# Patient Record
Sex: Female | Born: 1960 | Race: Black or African American | Hispanic: No | Smoking: Current every day smoker
Health system: Southern US, Community
[De-identification: ages and names within clinical notes are randomized; demographics above are authoritative.]

## PROBLEM LIST (undated history)

## (undated) DIAGNOSIS — N939 Abnormal uterine and vaginal bleeding, unspecified: Secondary | ICD-10-CM

## (undated) DIAGNOSIS — N888 Other specified noninflammatory disorders of cervix uteri: Secondary | ICD-10-CM

## (undated) DIAGNOSIS — E0590 Thyrotoxicosis, unspecified without thyrotoxic crisis or storm: Secondary | ICD-10-CM

## (undated) DIAGNOSIS — N879 Dysplasia of cervix uteri, unspecified: Secondary | ICD-10-CM

## (undated) HISTORY — PX: OTHER SURGICAL HISTORY: SHX169

## (undated) HISTORY — PX: TUBAL LIGATION: SHX77

---

## 1999-05-24 ENCOUNTER — Emergency Department (HOSPITAL_COMMUNITY): Admission: EM | Admit: 1999-05-24 | Discharge: 1999-05-24 | Payer: Self-pay | Admitting: Emergency Medicine

## 2000-06-07 ENCOUNTER — Ambulatory Visit (HOSPITAL_COMMUNITY): Admission: AD | Admit: 2000-06-07 | Discharge: 2000-06-07 | Payer: Self-pay | Admitting: *Deleted

## 2002-07-17 ENCOUNTER — Encounter: Payer: Self-pay | Admitting: Obstetrics

## 2002-07-17 ENCOUNTER — Ambulatory Visit (HOSPITAL_COMMUNITY): Admission: RE | Admit: 2002-07-17 | Discharge: 2002-07-17 | Payer: Self-pay | Admitting: Obstetrics

## 2003-06-07 ENCOUNTER — Encounter: Payer: Self-pay | Admitting: Internal Medicine

## 2003-06-07 LAB — CONVERTED CEMR LAB

## 2003-06-29 ENCOUNTER — Emergency Department (HOSPITAL_COMMUNITY): Admission: EM | Admit: 2003-06-29 | Discharge: 2003-06-29 | Payer: Self-pay | Admitting: Emergency Medicine

## 2007-05-27 ENCOUNTER — Emergency Department (HOSPITAL_COMMUNITY): Admission: EM | Admit: 2007-05-27 | Discharge: 2007-05-27 | Payer: Self-pay | Admitting: Emergency Medicine

## 2007-08-05 ENCOUNTER — Ambulatory Visit: Payer: Self-pay | Admitting: Internal Medicine

## 2007-08-05 ENCOUNTER — Encounter: Payer: Self-pay | Admitting: Internal Medicine

## 2007-08-05 DIAGNOSIS — M129 Arthropathy, unspecified: Secondary | ICD-10-CM | POA: Insufficient documentation

## 2007-08-05 DIAGNOSIS — N764 Abscess of vulva: Secondary | ICD-10-CM | POA: Insufficient documentation

## 2007-08-05 DIAGNOSIS — K219 Gastro-esophageal reflux disease without esophagitis: Secondary | ICD-10-CM | POA: Insufficient documentation

## 2007-08-05 LAB — CONVERTED CEMR LAB
AST: 16 units/L (ref 0–37)
Bilirubin, Direct: 0.1 mg/dL (ref 0.0–0.3)
Chloride: 106 meq/L (ref 96–112)
Eosinophils Absolute: 0.4 10*3/uL (ref 0.0–0.6)
Eosinophils Relative: 3.4 % (ref 0.0–5.0)
GFR calc non Af Amer: 115 mL/min
Glucose, Bld: 85 mg/dL (ref 70–99)
HCT: 34.3 % — ABNORMAL LOW (ref 36.0–46.0)
Hemoglobin: 11.5 g/dL — ABNORMAL LOW (ref 12.0–15.0)
Lymphocytes Relative: 15.6 % (ref 12.0–46.0)
MCV: 84.3 fL (ref 78.0–100.0)
Monocytes Absolute: 0.1 10*3/uL — ABNORMAL LOW (ref 0.2–0.7)
Neutrophils Relative %: 80.1 % — ABNORMAL HIGH (ref 43.0–77.0)
Potassium: 3.9 meq/L (ref 3.5–5.1)
RBC: 4.06 M/uL (ref 3.87–5.11)
Sodium: 139 meq/L (ref 135–145)
Total Bilirubin: 0.7 mg/dL (ref 0.3–1.2)
Uric Acid, Serum: 4.3 mg/dL (ref 2.4–7.0)
WBC: 11.6 10*3/uL — ABNORMAL HIGH (ref 4.5–10.5)

## 2007-08-10 ENCOUNTER — Emergency Department (HOSPITAL_COMMUNITY): Admission: EM | Admit: 2007-08-10 | Discharge: 2007-08-10 | Payer: Self-pay | Admitting: Emergency Medicine

## 2007-09-26 ENCOUNTER — Encounter: Payer: Self-pay | Admitting: Internal Medicine

## 2007-09-29 ENCOUNTER — Ambulatory Visit: Payer: Self-pay | Admitting: Internal Medicine

## 2007-09-29 DIAGNOSIS — L280 Lichen simplex chronicus: Secondary | ICD-10-CM | POA: Insufficient documentation

## 2007-09-29 DIAGNOSIS — R7309 Other abnormal glucose: Secondary | ICD-10-CM

## 2008-01-12 ENCOUNTER — Ambulatory Visit: Payer: Self-pay | Admitting: Endocrinology

## 2008-01-12 DIAGNOSIS — M2550 Pain in unspecified joint: Secondary | ICD-10-CM | POA: Insufficient documentation

## 2008-02-09 ENCOUNTER — Telehealth: Payer: Self-pay | Admitting: Endocrinology

## 2009-03-25 ENCOUNTER — Emergency Department (HOSPITAL_COMMUNITY): Admission: EM | Admit: 2009-03-25 | Discharge: 2009-03-26 | Payer: Self-pay | Admitting: Emergency Medicine

## 2009-11-28 ENCOUNTER — Emergency Department (HOSPITAL_COMMUNITY): Admission: EM | Admit: 2009-11-28 | Discharge: 2009-11-28 | Payer: Self-pay | Admitting: Emergency Medicine

## 2009-11-29 ENCOUNTER — Emergency Department (HOSPITAL_COMMUNITY): Admission: EM | Admit: 2009-11-29 | Discharge: 2009-11-29 | Payer: Self-pay | Admitting: Family Medicine

## 2010-04-24 ENCOUNTER — Emergency Department (HOSPITAL_COMMUNITY): Admission: EM | Admit: 2010-04-24 | Discharge: 2010-04-24 | Payer: Self-pay | Admitting: Emergency Medicine

## 2010-05-11 ENCOUNTER — Emergency Department (HOSPITAL_COMMUNITY): Admission: EM | Admit: 2010-05-11 | Discharge: 2010-05-11 | Payer: Self-pay | Admitting: Emergency Medicine

## 2010-07-21 ENCOUNTER — Emergency Department (HOSPITAL_COMMUNITY): Admission: EM | Admit: 2010-07-21 | Discharge: 2010-07-21 | Payer: Self-pay | Admitting: Emergency Medicine

## 2010-10-19 ENCOUNTER — Ambulatory Visit (HOSPITAL_COMMUNITY)
Admission: RE | Admit: 2010-10-19 | Discharge: 2010-10-19 | Payer: Self-pay | Source: Home / Self Care | Attending: Internal Medicine | Admitting: Internal Medicine

## 2010-10-27 ENCOUNTER — Encounter
Admission: RE | Admit: 2010-10-27 | Discharge: 2010-10-27 | Payer: Self-pay | Source: Home / Self Care | Attending: Family Medicine | Admitting: Family Medicine

## 2010-11-02 ENCOUNTER — Encounter (INDEPENDENT_AMBULATORY_CARE_PROVIDER_SITE_OTHER): Payer: Self-pay | Admitting: Family Medicine

## 2010-11-02 LAB — CONVERTED CEMR LAB
BUN: 10 mg/dL
CO2: 26 meq/L
Calcium: 9.1 mg/dL
Chloride: 105 meq/L
Creatinine, Ser: 0.61 mg/dL
Glucose, Bld: 87 mg/dL
Potassium: 4.7 meq/L
Sodium: 138 meq/L
TSH: 2.987 u[IU]/mL
Uric Acid, Serum: 4.8 mg/dL
Vit D, 25-Hydroxy: 13 ng/mL — ABNORMAL LOW

## 2010-11-03 ENCOUNTER — Ambulatory Visit (HOSPITAL_COMMUNITY)
Admission: RE | Admit: 2010-11-03 | Discharge: 2010-11-03 | Payer: Self-pay | Source: Home / Self Care | Attending: Family Medicine | Admitting: Family Medicine

## 2011-01-06 LAB — WOUND CULTURE

## 2011-01-06 LAB — DIFFERENTIAL
Basophils Absolute: 0 10*3/uL (ref 0.0–0.1)
Eosinophils Relative: 1 % (ref 0–5)
Lymphocytes Relative: 10 % — ABNORMAL LOW (ref 12–46)
Neutro Abs: 14.9 10*3/uL — ABNORMAL HIGH (ref 1.7–7.7)

## 2011-01-06 LAB — CBC
Platelets: 528 10*3/uL — ABNORMAL HIGH (ref 150–400)
RDW: 18.3 % — ABNORMAL HIGH (ref 11.5–15.5)
WBC: 17.3 10*3/uL — ABNORMAL HIGH (ref 4.0–10.5)

## 2011-01-06 LAB — URINE CULTURE
Colony Count: >=100000
Culture  Setup Time: 201107212139

## 2011-01-06 LAB — URINALYSIS, ROUTINE W REFLEX MICROSCOPIC
Ketones, ur: NEGATIVE mg/dL
Nitrite: NEGATIVE
pH: 5.5 (ref 5.0–8.0)

## 2011-01-06 LAB — URINE MICROSCOPIC-ADD ON

## 2011-01-06 LAB — BASIC METABOLIC PANEL
BUN: 8 mg/dL (ref 6–23)
Creatinine, Ser: 0.65 mg/dL (ref 0.4–1.2)
GFR calc non Af Amer: >60 mL/min (ref >60)

## 2011-01-29 LAB — WOUND CULTURE

## 2011-03-09 NOTE — Op Note (Signed)
[facility]  Patient:    Alicia Brandt, Alicia Brandt                     MRN: [id number] Proc. Date: 06/07/00 Adm. Date:  [id number] Disc. Date: [id number] Attending:  Venita Sheffield CC:         Kathreen Cosier, M.D.   Operative Report  PREOPERATIVE DIAGNOSIS:       Multiple perineal abscesses.  POSTOPERATIVE DIAGNOSIS:      Multiple perineal abscesses secondary to hydradenitis suppurativa.  OPERATION:                    Incision and drainage.  SURGEON:                      Marnee Spring. Wiliam Ke, M.D.  ASSISTANT:                    None.  ANESTHESIA:                   General by hospital.  DESCRIPTION OF PROCEDURE:     Under good general anesthesia the patient was placed in the dorsal lithotomy position.  The entire perineum and both groin folds were markedly indurated with several areas of pus drainage.  There was only one abscess that was deeper than subcutaneous and this was near the left labia majora.  The abscess was opened widely, pus was drained and cultured and the wound was packed with Iodoform gauze.  Five other areas of pus drainage in the perineum and paravaginal perirectal area were drained and packed with Iodoform gauze.  After hemostasis was obtained with electrocautery current, wounds were dressed with peri pad and the patient was taken down from lithotomy position and left the operating room in satisfactory condition after sponge and needle counts were verified. DD:  06/07/00 TD:  06/10/00 Job: [id number]

## 2011-04-19 ENCOUNTER — Emergency Department (HOSPITAL_COMMUNITY)
Admission: EM | Admit: 2011-04-19 | Discharge: 2011-04-19 | Disposition: A | Discharge status: Home / Self Care | Payer: No Typology Code available for payment source | Attending: Emergency Medicine | Admitting: Emergency Medicine

## 2011-04-19 DIAGNOSIS — M542 Cervicalgia: Secondary | ICD-10-CM | POA: Insufficient documentation

## 2011-08-21 ENCOUNTER — Emergency Department (HOSPITAL_COMMUNITY)
Admission: EM | Admit: 2011-08-21 | Discharge: 2011-08-21 | Disposition: A | Discharge status: Home / Self Care | Payer: Self-pay | Attending: Emergency Medicine | Admitting: Emergency Medicine

## 2011-08-21 DIAGNOSIS — L2989 Other pruritus: Secondary | ICD-10-CM | POA: Insufficient documentation

## 2011-08-21 DIAGNOSIS — IMO0002 Reserved for concepts with insufficient information to code with codable children: Secondary | ICD-10-CM | POA: Insufficient documentation

## 2011-08-21 DIAGNOSIS — L298 Other pruritus: Secondary | ICD-10-CM | POA: Insufficient documentation

## 2011-08-21 DIAGNOSIS — L02219 Cutaneous abscess of trunk, unspecified: Secondary | ICD-10-CM | POA: Insufficient documentation

## 2011-08-21 LAB — DIFFERENTIAL
Lymphocytes Relative: 18 % (ref 12–46)
Lymphs Abs: 2.3 10*3/uL (ref 0.7–4.0)
Monocytes Absolute: 0.7 10*3/uL (ref 0.1–1.0)
Monocytes Relative: 6 % (ref 3–12)
Neutro Abs: 9.4 10*3/uL — ABNORMAL HIGH (ref 1.7–7.7)

## 2011-08-21 LAB — BASIC METABOLIC PANEL
BUN: 8 mg/dL (ref 6–23)
CO2: 25 mEq/L (ref 19–32)
Calcium: 9.1 mg/dL (ref 8.4–10.5)
GFR calc non Af Amer: 82 mL/min — ABNORMAL LOW (ref >90)
Glucose, Bld: 86 mg/dL (ref 70–99)

## 2011-08-21 LAB — CBC
HCT: 29.7 % — ABNORMAL LOW (ref 36.0–46.0)
Hemoglobin: 9.8 g/dL — ABNORMAL LOW (ref 12.0–15.0)
MCH: 25.1 pg — ABNORMAL LOW (ref 26.0–34.0)
MCHC: 33 g/dL (ref 30.0–36.0)
MCV: 76.2 fL — ABNORMAL LOW (ref 78.0–100.0)

## 2014-12-24 ENCOUNTER — Emergency Department (HOSPITAL_COMMUNITY): Payer: Self-pay

## 2014-12-24 ENCOUNTER — Emergency Department (HOSPITAL_COMMUNITY)
Admission: EM | Admit: 2014-12-24 | Discharge: 2014-12-24 | Disposition: A | Discharge status: Home / Self Care | Payer: Self-pay | Attending: Emergency Medicine | Admitting: Emergency Medicine

## 2014-12-24 ENCOUNTER — Encounter (HOSPITAL_COMMUNITY): Payer: Self-pay

## 2014-12-24 DIAGNOSIS — M7041 Prepatellar bursitis, right knee: Secondary | ICD-10-CM | POA: Insufficient documentation

## 2014-12-24 DIAGNOSIS — Z79899 Other long term (current) drug therapy: Secondary | ICD-10-CM | POA: Insufficient documentation

## 2014-12-24 DIAGNOSIS — Z720 Tobacco use: Secondary | ICD-10-CM | POA: Insufficient documentation

## 2014-12-24 DIAGNOSIS — Y9389 Activity, other specified: Secondary | ICD-10-CM | POA: Insufficient documentation

## 2014-12-24 MED ORDER — KETOROLAC TROMETHAMINE 60 MG/2ML IM SOLN
30.0000 mg | Freq: Once | INTRAMUSCULAR | Status: AC
  Administered 2014-12-24: 30 mg via INTRAMUSCULAR
  Filled 2014-12-24: qty 2

## 2014-12-24 MED ORDER — NAPROXEN 500 MG PO TABS: 500.0000 mg | ORAL_TABLET | Freq: Two times a day (BID) | ORAL | Status: DC

## 2014-12-24 MED ORDER — ACETAMINOPHEN 325 MG PO TABS
650.0000 mg | ORAL_TABLET | Freq: Once | ORAL | Status: AC
  Administered 2014-12-24: 650 mg via ORAL
  Filled 2014-12-24: qty 2

## 2014-12-24 NOTE — Discharge Instructions (Signed)
Bursitis Bursitis is a swelling and soreness (inflammation) of a fluid-filled sac (bursa) that overlies and protects a joint. It can be caused by injury, overuse of the joint, arthritis or infection. The joints most likely to be affected are the elbows, shoulders, hips and knees. HOME CARE INSTRUCTIONS   Apply ice to the affected area for 15-20 minutes each hour while awake for 2 days. Put the ice in a plastic bag and place a towel between the bag of ice and your skin.  Rest the injured joint as much as possible, but continue to put the joint through a full range of motion, 4 times per day. (The shoulder joint especially becomes rapidly "frozen" if not used.) When the pain lessens, begin normal slow movements and usual activities.  Only take over-the-counter or prescription medicines for pain, discomfort or fever as directed by your caregiver.  Your caregiver may recommend draining the bursa and injecting medicine into the bursa. This may help the healing process.  Follow all instructions for follow-up with your caregiver. This includes any orthopedic referrals, physical therapy and rehabilitation. Any delay in obtaining necessary care could result in a delay or failure of the bursitis to heal and chronic pain. SEEK IMMEDIATE MEDICAL CARE IF:   Your pain increases even during treatment.  You develop an oral temperature above 102 F (38.9 C) and have heat and inflammation over the involved bursa. MAKE SURE YOU:   Understand these instructions.  Will watch your condition.  Will get help right away if you are not doing well or get worse. Document Released: 10/05/2000 Document Revised: 12/31/2011 Document Reviewed: 12/28/2013 [facility] Patient Information 2015 [place], [place]. This information is not intended to replace advice given to you by your health care provider. Make sure you discuss any questions you have with your health care provider.  Heat Therapy Heat therapy can help make  painful, stiff muscles and joints feel better. Do not use heat on new injuries. Wait at least 48 hours after an injury to use heat. Do not use heat when you have aches or pains right after an activity. If you still have pain 3 hours after stopping the activity, then you may use heat. HOME CARE Wet heat pack  Soak a clean towel in warm water. Squeeze out the extra water.  Put the warm, wet towel in a plastic bag.  Place a thin, dry towel between your skin and the bag.  Put the heat pack on the area for 5 minutes, and check your skin. Your skin may be pink, but it should not be red.  Leave the heat pack on the area for 15 to 30 minutes.  Repeat this every 2 to 4 hours while awake. Do not use heat while you are sleeping. Warm water bath  Fill a tub with warm water.  Place the affected body part in the tub.  Soak the area for 20 to 40 minutes.  Repeat as needed. Hot water bottle  Fill the water bottle half full with hot water.  Press out the extra air. Close the cap tightly.  Place a dry towel between your skin and the bottle.  Put the bottle on the area for 5 minutes, and check your skin. Your skin may be pink, but it should not be red.  Leave the bottle on the area for 15 to 30 minutes.  Repeat this every 2 to 4 hours while awake. Electric heating pad  Place a dry towel between your skin and the  heating pad.  Set the heating pad on low heat.  Put the heating pad on the area for 10 minutes, and check your skin. Your skin may be pink, but it should not be red.  Leave the heating pad on the area for 20 to 40 minutes.  Repeat this every 2 to 4 hours while awake.  Do not lie on the heating pad.  Do not fall asleep while using the heating pad.  Do not use the heating pad near water. GET HELP RIGHT AWAY IF:  You get blisters or red skin.  Your skin is puffy (swollen), or you lose feeling (numbness) in the affected area.  You have any new problems.  Your problems  are getting worse.  You have any questions or concerns. If you have any problems, stop using heat therapy until you see your doctor. MAKE SURE YOU:  Understand these instructions.  Will watch your condition.  Will get help right away if you are not doing well or get worse. Document Released: 12/31/2011 Document Reviewed: 12/01/2013 [facility] Patient Information 2015 [place]. This information is not intended to replace advice given to you by your health care provider. Make sure you discuss any questions you have with your health care provider.

## 2014-12-24 NOTE — ED Provider Notes (Signed)
CSN: [id number]     Arrival date & time 12/24/14  1043 History   First MD Initiated Contact with Patient 12/24/14 1135     Chief Complaint  Patient presents with  . Joint Swelling   Alicia Brandt is a 54 y.o. female who presents to the emergency department complaining of right knee pain and swelling for the past 7 days. Patient denies any trauma or injury to her right knee. Patient rates her pain a 10 out of 10 and worse with movement. Patient is able to walk has difficulty walking. Reports taking 400 mg Advil earlier today with some relief. The patient denies fevers, chills or recent illness. The patient has history of arthritis.  (Consider location/radiation/quality/duration/timing/severity/associated sxs/prior Treatment) HPI  History reviewed. No pertinent past medical history. Past Surgical History  Procedure Laterality Date  . Tubal ligation     Family History  Problem Relation Age of Onset  . Cancer Mother   . Diabetes Brother    History  Substance Use Topics  . Smoking status: Current Every Day Smoker -- 0.50 packs/day    Types: Cigarettes  . Smokeless tobacco: Never Used  . Alcohol Use: No   OB History    No data available     Review of Systems  Constitutional: Negative for fever and chills.  Musculoskeletal: Positive for joint swelling.  Skin: Negative for rash and wound.      Allergies  Review of patient's allergies indicates no known allergies.  Home Medications   Prior to Admission medications   Medication Sig Start Date End Date Taking? Authorizing Provider  CALCIUM PO Take 1 tablet by mouth daily.   Yes Historical Provider, MD  ibuprofen (ADVIL,MOTRIN) 200 MG tablet Take 800 mg by mouth every 6 (six) hours as needed for mild pain or moderate pain.   Yes Historical Provider, MD  naproxen (NAPROSYN) 500 MG tablet Take 1 tablet (500 mg total) by mouth 2 (two) times daily with a meal. 12/24/14   Verda Cumins Annah Jasko, PA-C   BP 131/62 mmHg  Pulse 74   Temp(Src) 98.6 F (37 C) (Oral)  Resp 18  Ht 5\' 6"  (1.676 m)  Wt 165 lb (74.844 kg)  BMI 26.64 kg/m2  SpO2 100%  LMP 05/05/2014 (Approximate) Physical Exam  Constitutional: She appears well-developed and well-nourished. No distress.  HENT:  Head: Normocephalic and atraumatic.  Eyes: Right eye exhibits no discharge. Left eye exhibits no discharge.  Pulmonary/Chest: Effort normal. No respiratory distress.  Musculoskeletal: She exhibits edema and tenderness.  Mild edema noted to the anterior aspect of her right knee.  Full passive range of motion of her right knee. Pain with active range of motion. No erythema, or warmth. No deformity.   Neurological: She is alert. Coordination normal.  Skin: Skin is warm and dry. No rash noted. She is not diaphoretic. No erythema. No pallor.  Psychiatric: She has a normal mood and affect. Her behavior is normal.  Nursing note and vitals reviewed.   ED Course  Procedures (including critical care time) Labs Review Labs Reviewed - No data to display  Imaging Review Dg Knee Complete 4 Views Right  12/24/2014   CLINICAL DATA:  Acute right knee pain and swelling for 7 days without injury. Initial encounter.  EXAM: RIGHT KNEE - COMPLETE 4+ VIEW  COMPARISON:  November 03, 2010.  FINDINGS: No fracture or dislocation is noted. Moderate suprapatellar joint effusion is noted. Moderate narrowing is noted medially and laterally. No soft tissue abnormality is noted.  IMPRESSION: Moderate suprapatellar joint effusion. Moderate degenerative joint disease is noted. No fracture or dislocation is noted.   Electronically Signed   By: Marijo Conception, M.D.   On: 12/24/2014 12:41     EKG Interpretation None      Filed Vitals:   12/24/14 1107  BP: 131/62  Pulse: 74  Temp: 98.6 F (37 C)  TempSrc: Oral  Resp: 18  Height: 5\' 6"  (1.676 m)  Weight: 165 lb (74.844 kg)  SpO2: 100%     MDM   Meds given in ED:  Medications  acetaminophen (TYLENOL) tablet 650 mg  (650 mg Oral Given 12/24/14 1206)  ketorolac (TORADOL) injection 30 mg (30 mg Intramuscular Given 12/24/14 1307)    Discharge Medication List as of 12/24/2014  1:04 PM    START taking these medications   Details  naproxen (NAPROSYN) 500 MG tablet Take 1 tablet (500 mg total) by mouth 2 (two) times daily with a meal., Starting 12/24/2014, Until Discontinued, Print        Final diagnoses:  Prepatellar bursitis, right   This is a 54 y.o. female who presents to the emergency department complaining of right knee pain and swelling for the past 7 days. She denies any injury or trauma. There is mild anterior knee edema with some tenderness. She has full range of motion. She is able to ambulate.  X ray shows moderate suprapatellar joint effusion. This is likely prepatellar bursitis. We'll discharge patient with knee sleeve and short course of naproxen for pain control. Also discussed RICE treatment with patient. I advised the patient to follow-up with their primary care provider this week. I advised the patient to return to the emergency department with new or worsening symptoms or new concerns. The patient verbalized understanding and agreement with plan.   This patient was discussed with Dr. Colin Rhein who agrees with assessment and plan.    Hanley Hays, PA-C 12/24/14 2023  Debby Freiberg, MD 12/25/14 0830

## 2014-12-24 NOTE — ED Notes (Signed)
Patient c/o right knee swelling and pain x 7 days. Patient has been using heat and ice therapy and Advil, but no relief. Patient denies any injury.

## 2016-06-13 ENCOUNTER — Ambulatory Visit (HOSPITAL_COMMUNITY)
Admission: EM | Admit: 2016-06-13 | Discharge: 2016-06-13 | Disposition: A | Discharge status: Home / Self Care | Payer: Self-pay | Attending: Family Medicine | Admitting: Family Medicine

## 2016-06-13 ENCOUNTER — Encounter (HOSPITAL_COMMUNITY): Payer: Self-pay | Admitting: Emergency Medicine

## 2016-06-13 ENCOUNTER — Ambulatory Visit (INDEPENDENT_AMBULATORY_CARE_PROVIDER_SITE_OTHER): Payer: Self-pay

## 2016-06-13 DIAGNOSIS — M25461 Effusion, right knee: Secondary | ICD-10-CM

## 2016-06-13 MED ORDER — HYDROCODONE-ACETAMINOPHEN 5-325 MG PO TABS: 1.0000 | ORAL_TABLET | Freq: Four times a day (QID) | ORAL | 0 refills | Status: DC | PRN

## 2016-06-13 NOTE — ED Provider Notes (Signed)
[facility]    CSN: [id number] Arrival date & time: 06/13/16  1449  First Provider Contact:  First MD Initiated Contact with Patient 06/13/16 1544        History   Chief Complaint Chief Complaint  Patient presents with  . Knee Pain    HPI Alicia Brandt is a 55 y.o. female.   The history is provided by the patient.  Knee Pain  Location:  Knee Time since incident:  2 weeks Injury: no   Knee location:  R knee Pain details:    Quality:  Aching   Radiates to:  Does not radiate   Severity:  Moderate   Onset quality:  Gradual   Progression:  Worsening Chronicity:  New Dislocation: no   Prior injury to area:  No Relieved by:  Elevation Worsened by:  Bearing weight Ineffective treatments:  Elevation Associated symptoms: decreased ROM, stiffness and swelling     History reviewed. No pertinent past medical history.  Patient Active Problem List   Diagnosis Date Noted  . ARTHRALGIA 01/12/2008  . 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 OTHER ABNORMAL GLUCOSE 09/29/2007  . GERD 08/05/2007  . ABSCESS, VULVA NEC 08/05/2007  . ARTHROPATHY NOS, MULTIPLE SITES 08/05/2007    Past Surgical History:  Procedure Laterality Date  . TUBAL LIGATION      OB History    No data available       Home Medications    Prior to Admission medications   Medication Sig Start Date End Date Taking? Authorizing Provider  CALCIUM PO Take 1 tablet by mouth daily.    Historical Provider, MD  ibuprofen (ADVIL,MOTRIN) 200 MG tablet Take 800 mg by mouth every 6 (six) hours as needed for mild pain or moderate pain.    Historical Provider, MD  naproxen (NAPROSYN) 500 MG tablet Take 1 tablet (500 mg total) by mouth 2 (two) times daily with a meal. 12/24/14   Waynetta Pean, PA-C    Family History Family History  Problem Relation Age of Onset  . Cancer Mother   . Diabetes Brother     Social History Social History  Substance Use Topics  . Smoking status:  Current Every Day Smoker    Packs/day: 0.50    Types: Cigarettes  . Smokeless tobacco: Never Used  . Alcohol use No     Allergies   Review of patient's allergies indicates no known allergies.   Review of Systems Review of Systems  Constitutional: Negative.   Musculoskeletal: Positive for gait problem, joint swelling and stiffness.  All other systems reviewed and are negative.    Physical Exam Triage Vital Signs ED Triage Vitals  Enc Vitals Group     BP 06/13/16 1532 117/58     Pulse Rate 06/13/16 1532 69     Resp 06/13/16 1532 18     Temp 06/13/16 1532 98.4 F (36.9 C)     Temp Source 06/13/16 1532 Oral     SpO2 06/13/16 1532 100 %     Weight --      Height --      Head Circumference --      Peak Flow --      Pain Score 06/13/16 1541 10     Pain Loc --      Pain Edu? --      Excl. in [place]? --    No data found.   Updated Vital Signs BP 117/58 (BP Location: Right Arm)   Pulse 69  Temp 98.4 F (36.9 C) (Oral)   Resp 18   LMP 05/05/2014 (Approximate)   SpO2 100%   Visual Acuity Right Eye Distance:   Left Eye Distance:   Bilateral Distance:    Right Eye Near:   Left Eye Near:    Bilateral Near:     Physical Exam  Constitutional: She is oriented to person, place, and time. She appears well-developed and well-nourished. No distress.  Musculoskeletal: She exhibits edema, tenderness and deformity.       Right knee: She exhibits decreased range of motion, swelling, effusion, deformity, abnormal alignment and bony tenderness. Tenderness found. Medial joint line tenderness noted.  Neurological: She is alert and oriented to person, place, and time.  Skin: Skin is warm and dry.  Nursing note and vitals reviewed.    UC Treatments / Results  Labs (all labs ordered are listed, but only abnormal results are displayed) Labs Reviewed - No data to display  EKG  EKG Interpretation None       Radiology No results found. X-rays reviewed and report per  radiologist.  Procedures Procedures (including critical care time)  Medications Ordered in UC Medications - No data to display   Initial Impression / Assessment and Plan / UC Course  I have reviewed the triage vital signs and the nursing notes.  Pertinent labs & imaging results that were available during my care of the patient were reviewed by me and considered in my medical decision making (see chart for details).  Clinical Course      Final Clinical Impressions(s) / UC Diagnoses   Final diagnoses:  None    New Prescriptions New Prescriptions   No medications on file     Billy Fischer, MD 06/13/16 1649

## 2016-06-13 NOTE — ED Triage Notes (Signed)
C/o right knee pain States knee is swollen

## 2016-06-13 NOTE — Discharge Instructions (Signed)
See orthopedist when possible.

## 2019-04-05 ENCOUNTER — Emergency Department (HOSPITAL_COMMUNITY)
Admission: EM | Admit: 2019-04-05 | Discharge: 2019-04-05 | Disposition: A | Discharge status: Home / Self Care | Payer: Self-pay | Attending: Emergency Medicine | Admitting: Emergency Medicine

## 2019-04-05 ENCOUNTER — Emergency Department (HOSPITAL_COMMUNITY): Payer: Self-pay

## 2019-04-05 ENCOUNTER — Encounter (HOSPITAL_COMMUNITY): Payer: Self-pay | Admitting: Emergency Medicine

## 2019-04-05 DIAGNOSIS — Z79899 Other long term (current) drug therapy: Secondary | ICD-10-CM | POA: Insufficient documentation

## 2019-04-05 DIAGNOSIS — E041 Nontoxic single thyroid nodule: Secondary | ICD-10-CM

## 2019-04-05 DIAGNOSIS — R0602 Shortness of breath: Secondary | ICD-10-CM

## 2019-04-05 DIAGNOSIS — M549 Dorsalgia, unspecified: Secondary | ICD-10-CM

## 2019-04-05 DIAGNOSIS — F17210 Nicotine dependence, cigarettes, uncomplicated: Secondary | ICD-10-CM | POA: Insufficient documentation

## 2019-04-05 LAB — CBC WITH DIFFERENTIAL/PLATELET
Abs Immature Granulocytes: 0.07 10*3/uL (ref 0.00–0.07)
Basophils Absolute: 0.1 10*3/uL (ref 0.0–0.1)
Basophils Relative: 0 %
Eosinophils Absolute: 0.1 10*3/uL (ref 0.0–0.5)
Eosinophils Relative: 1 %
HCT: 38.8 % (ref 36.0–46.0)
Hemoglobin: 13 g/dL (ref 12.0–15.0)
Immature Granulocytes: 0 %
Lymphocytes Relative: 8 %
Lymphs Abs: 1.3 10*3/uL (ref 0.7–4.0)
MCH: 27.8 pg (ref 26.0–34.0)
MCHC: 33.5 g/dL (ref 30.0–36.0)
MCV: 83.1 fL (ref 80.0–100.0)
Monocytes Absolute: 0.9 10*3/uL (ref 0.1–1.0)
Monocytes Relative: 6 %
Neutro Abs: 13.3 10*3/uL — ABNORMAL HIGH (ref 1.7–7.7)
Neutrophils Relative %: 85 %
Platelets: 421 10*3/uL — ABNORMAL HIGH (ref 150–400)
RBC: 4.67 MIL/uL (ref 3.87–5.11)
RDW: 14.1 % (ref 11.5–15.5)
WBC: 15.7 10*3/uL — ABNORMAL HIGH (ref 4.0–10.5)
nRBC: 0 % (ref 0.0–0.2)

## 2019-04-05 LAB — BASIC METABOLIC PANEL
Anion gap: 9 (ref 5–15)
BUN: 11 mg/dL (ref 6–20)
CO2: 23 mmol/L (ref 22–32)
Calcium: 9.4 mg/dL (ref 8.9–10.3)
Chloride: 103 mmol/L (ref 98–111)
Creatinine, Ser: 0.72 mg/dL (ref 0.44–1.00)
GFR calc Af Amer: >60 mL/min (ref >60)
GFR calc non Af Amer: >60 mL/min (ref >60)
Glucose, Bld: 111 mg/dL — ABNORMAL HIGH (ref 70–99)
Potassium: 3.8 mmol/L (ref 3.5–5.1)
Sodium: 135 mmol/L (ref 135–145)

## 2019-04-05 LAB — D-DIMER, QUANTITATIVE: D-Dimer, Quant: 1.12 ug/mL-FEU — ABNORMAL HIGH (ref 0.00–0.50)

## 2019-04-05 MED ORDER — CYCLOBENZAPRINE HCL 10 MG PO TABS: 10.0000 mg | ORAL_TABLET | Freq: Two times a day (BID) | ORAL | 0 refills | Status: DC | PRN

## 2019-04-05 MED ORDER — IOHEXOL 350 MG/ML SOLN
100.0000 mL | Freq: Once | INTRAVENOUS | Status: AC | PRN
  Administered 2019-04-05: 100 mL via INTRAVENOUS

## 2019-04-05 MED ORDER — FENTANYL CITRATE (PF) 100 MCG/2ML IJ SOLN
50.0000 ug | Freq: Once | INTRAMUSCULAR | Status: AC
Start: 2019-04-05 — End: 2019-04-05
  Administered 2019-04-05: 50 ug via INTRAVENOUS
  Filled 2019-04-05: qty 2

## 2019-04-05 MED ORDER — KETOROLAC TROMETHAMINE 15 MG/ML IJ SOLN
15.0000 mg | Freq: Once | INTRAMUSCULAR | Status: AC
  Administered 2019-04-05: 15 mg via INTRAVENOUS
  Filled 2019-04-05: qty 1

## 2019-04-05 NOTE — ED Notes (Signed)
ED Provider at bedside. 

## 2019-04-05 NOTE — Discharge Instructions (Addendum)
You need to follow-up with a primary care provider regarding the nodule in your thyroid gland.  You can take ibuprofen along with the Flexeril.  Return to the emergency department if you have any worsening symptoms.

## 2019-04-05 NOTE — ED Provider Notes (Signed)
Patient is a 58 year old female who presents with upper back pain.  On my exam it is bilateral and starts in the bilateral upper thoracic region and goes down into the lower back.  There is no spinal tenderness.  She was having some associated shortness of breath and therefore d-dimer was obtained which was positive.  CT scan shows no evidence of PE.  There is a thyroid nodule which will need outpatient follow-up.  There was no pulmonary nodules noted on CT imaging.  I discussed these findings with the patient.  I did stress the need that she will need to follow-up with a primary care provider regarding management of the thyroid nodule.  She was given a prescription for Flexeril and advised that she can also use ibuprofen along with this.  Return precautions were given.   Malvin Johns, MD 04/05/19 1010

## 2019-04-05 NOTE — ED Provider Notes (Signed)
[facility] DE[facility] Provider Note  CSN: [id number] Arrival date & time: 04/05/19 0510  Chief Complaint(s) Back Pain and Shortness of Breath  HPI Alicia Brandt is a 58 y.o. female who presents to the emergency department with sudden onset back pain that began 2 days ago with associated shortness of breath and DOE.  Pain is been constant and fluctuating since onset.  Exacerbated with movement and deep breathing.  Pain is moderate to severe in nature.  No alleviating factors.  Patient also endorses left upper chest pain described as an ache.  Pain is non-radiating and nonexertional.  She denies any recent fevers.  Endorses chronic cough from smoking.  No abdominal pain.  No nausea or vomiting.  In addition patient reports right axillary abscess which is draining.  Similar to prior abscesses in the past.  Patient also endorsed bilateral knee pain which is chronic.  Alleviated by topical creams.  No lower extremity swelling.  No prior history of DVTs or PEs.  No autoimmune disorders, personal history of cancer.  No prolonged immobilization.     Past Medical History History reviewed. No pertinent past medical history. 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 Authorizing Provider  CALCIUM PO Take 1 tablet by mouth daily.    [provider]  HYDROcodone-acetaminophen (NORCO/VICODIN) 5-325 MG tablet Take 1 tablet by mouth every 6 (six) hours as needed. Prn pain 06/13/16   Billy Fischer, MD  ibuprofen (ADVIL,MOTRIN) 200 MG tablet Take 800 mg by mouth every 6 (six) hours as needed for mild pain or moderate pain.    [provider]  naproxen (NAPROSYN) 500 MG tablet Take 1 tablet (500 mg total) by mouth 2 (two) times daily with a meal. 12/24/14   Sharmaine Base                                                                                                                                    Past Surgical History Past Surgical History:  Procedure Laterality Date   TUBAL LIGATION     Family History Family History  Problem Relation Age of Onset   Cancer Mother    Diabetes Brother     Social History Social History   Tobacco Use   Smoking status: Current Every Day Smoker    Packs/day: 0.50    Types: Cigarettes   Smokeless tobacco: Never Used  Substance Use Topics   Alcohol use: No   Drug use: No   Allergies Patient has no known allergies.  Review of Systems Review of Systems   All other systems are reviewed and  are negative for acute change except as noted in the HPI  Physical Exam Vital Signs  I have reviewed the triage vital signs BP 119/63    Pulse (!) 58    Temp 98.2 F (36.8 C)    Resp 16    Ht 5\' 4"  (1.626 m)    Wt 83.9 kg    LMP 05/05/2014 (Approximate)    SpO2 99%    BMI 31.76 kg/m   Physical Exam Vitals signs reviewed.  Constitutional:      General: She is not in acute distress.    Appearance: She is well-developed. She is not diaphoretic.  HENT:     Head: Normocephalic and atraumatic.     Nose: Nose normal.  Eyes:     General: No scleral icterus.       Right eye: No discharge.        Left eye: No discharge.     Conjunctiva/sclera: Conjunctivae normal.     Pupils: Pupils are equal, round, and reactive to light.  Neck:     Musculoskeletal: Normal range of motion and neck supple.  Cardiovascular:     Rate and Rhythm: Normal rate and regular rhythm.     Heart sounds: No murmur. No friction rub. No gallop.   Pulmonary:     Effort: Pulmonary effort is normal. No respiratory distress.     Breath sounds: Normal breath sounds. No stridor. No rales.  Chest:      Chest wall: Tenderness present.    Abdominal:     General: There is no distension.     Palpations: Abdomen is soft.     Tenderness: There is no abdominal tenderness.  Musculoskeletal:     Cervical back: She exhibits tenderness and spasm.       Back:  Skin:    General: Skin is warm and dry.     Findings: No erythema or rash.  Neurological:     Mental Status: She is alert and oriented to person, place, and time.     ED Results and Treatments Labs (all labs ordered are listed, but only abnormal results are displayed) Labs Reviewed  CBC WITH DIFFERENTIAL/PLATELET - Abnormal; Notable for the following components:      Result Value   WBC 15.7 (*)    Platelets 421 (*)    Neutro Abs 13.3 (*)    All other components within normal limits  D-DIMER, QUANTITATIVE (NOT AT [facility]) - Abnormal; Notable for the following components:   D-Dimer, Quant 1.12 (*)    All other components within normal limits  BASIC METABOLIC PANEL                                                                                                                         EKG  EKG Interpretation  Date/Time:  Sunday April 05 2019 05:22:22 EDT Ventricular Rate:  78 PR Interval:    QRS Duration: 75 QT Interval:  368 QTC Calculation: 420 R Axis:  52 Text Interpretation:  Sinus rhythm Baseline wander in lead(s) II III aVF NO STEMI. No old tracing to compare Confirmed by Addison Lank [phone]) on 04/05/2019 5:49:42 AM      Radiology Dg Chest 2 View  Result Date: 04/05/2019 CLINICAL DATA:  Initial evaluation for acute left anterior chest pain. EXAM: CHEST - 2 VIEW COMPARISON:  None available. FINDINGS: Cardiac and mediastinal silhouettes are within normal limits. Lungs normally inflated. No focal infiltrates. No pulmonary edema or pleural effusion. No pneumothorax. Approximate 9 mm nodular density overlies the left upper lobe, 6 intercostal space. No acute osseous finding. IMPRESSION: 1. No active cardiopulmonary  disease. 2. 9 mm nodular density overlying the left upper lobe. Further evaluation with dedicated cross-sectional imaging of the chest suggested for further evaluation. Electronically Signed   By: Jeannine Boga M.D.   On: 04/05/2019 06:48   Pertinent labs & imaging results that were available during my care of the patient were reviewed by me and considered in my medical decision making (see chart for details).  Medications Ordered in ED Medications  ketorolac (TORADOL) 15 MG/ML injection 15 mg (15 mg Intravenous Given 04/05/19 8242)                                                                                                                                    Procedures Procedures  (including critical care time)  Medical Decision Making / ED Course I have reviewed the nursing notes for this encounter and the patient's prior records (if available in EHR or on provided paperwork).    Pain appears to be muscular in nature.  However patient becomes short of breath with movement even in bed.  She is not febrile or tachycardic she is satting well on room air.  Low suspicion for cardiac etiology. EKG nonischemic. Low suspicion for PE but cannot PERC out.  Chest x-ray without evidence of pneumonia.  It did reveal a left upper lobe nodule which the patient was made aware.  Labs notable for leukocytosis and an elevated dimer.  This may be related to abscess but will obtain a CT scan to rule out PE.  Patient care turned over to Dr Tamera Punt at 0710. Patient case and results discussed in detail; please see their note for further ED managment.      Final Clinical Impression(s) / ED Diagnoses Final diagnoses:  SOB (shortness of breath)      This chart was dictated using voice recognition software.  Despite best efforts to proofread,  errors can occur which can change the documentation meaning.   Fatima Blank, MD 04/05/19 [phone]

## 2019-04-05 NOTE — ED Triage Notes (Signed)
Patient presents to then ED for complaints of upper and lower back pain associated bilateral knee pain reports feels short of breath and having muscle spasms. Reports took Advil with no relief. Able to speak in complete sentences, Reports pain 10/10.

## 2019-09-08 ENCOUNTER — Other Ambulatory Visit: Payer: Self-pay

## 2019-09-08 DIAGNOSIS — Z20822 Contact with and (suspected) exposure to covid-19: Secondary | ICD-10-CM

## 2019-09-10 LAB — NOVEL CORONAVIRUS, NAA: SARS-CoV-2, NAA: NOT DETECTED

## 2019-09-22 ENCOUNTER — Other Ambulatory Visit: Payer: Self-pay

## 2019-09-22 DIAGNOSIS — Z20822 Contact with and (suspected) exposure to covid-19: Secondary | ICD-10-CM

## 2019-09-25 LAB — NOVEL CORONAVIRUS, NAA: SARS-CoV-2, NAA: NOT DETECTED

## 2019-10-18 IMAGING — CT CT ANGIOGRAPHY CHEST
1 of 6 series · 3 of 16 positions shown · IV contrast (omnipaque)
Comparison: 04/05/2019

CLINICAL DATA: Positive D-dimer, upper lower back pain, shortness
of breath

EXAM:
CT ANGIOGRAPHY CHEST WITH CONTRAST
TECHNIQUE: Multidetector CT imaging of the chest was performed using the
standard protocol during bolus administration of intravenous
contrast. Multiplanar CT image reconstructions and MIPs were
obtained to evaluate the vascular anatomy.
CONTRAST:  100mL OMNIPAQUE IOHEXOL 350 MG/ML SOLN

[Series 7: pe thins · axial · 0.59mm/px · z∈[+1356,+1478]mm · 3 of 349 slices shown]
[im 88/349  lung]
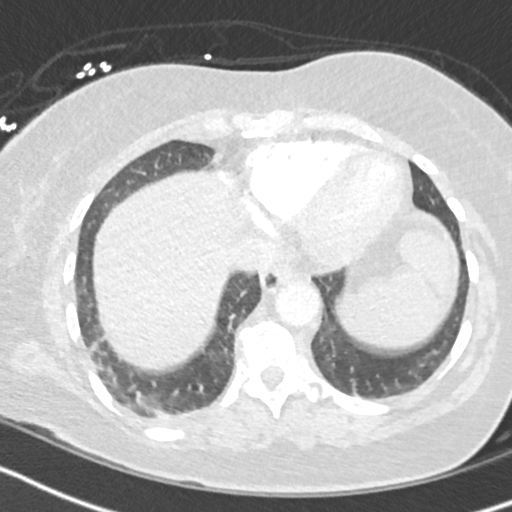
[im 175/349  soft-tissue]
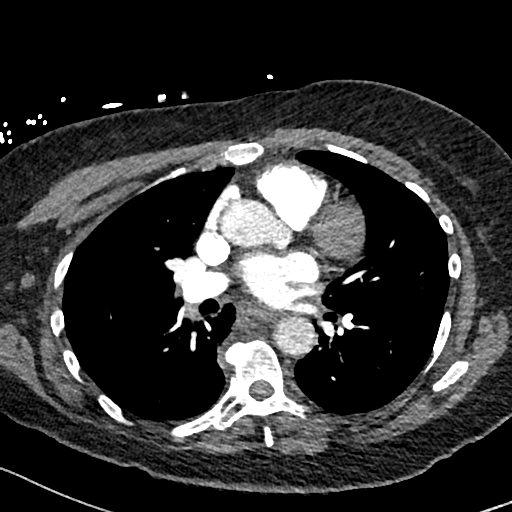
[im 262/349  lung]
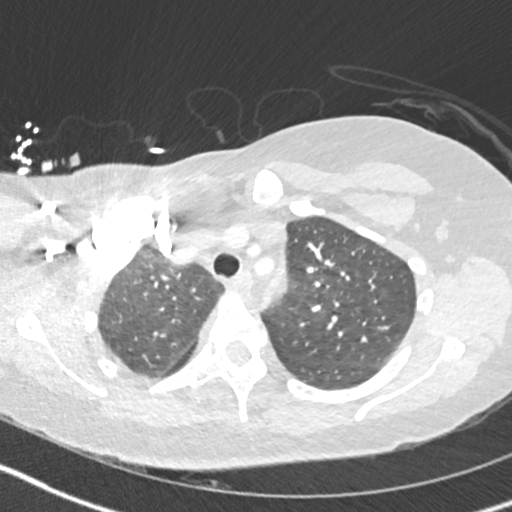

[3 of 16 positions shown; findings below may reference images not displayed]

FINDINGS: Cardiovascular: No significant filling defect or pulmonary embolus
demonstrated by CTA. Normal heart size. No pericardial effusion.
Minor atherosclerosis of the aorta. Negative for aneurysm or
dissection. No mediastinal hemorrhage or hematoma.

Mediastinum/Nodes: 4.2 cm right inferior thyroid mass noted.
Recommend follow-up thyroid ultrasound. This results in slight
leftward tracheal deviation. Trachea and central airways are patent.
Esophagus is nondilated. No adenopathy.

Lungs/Pleura: Minor dependent basilar atelectasis. No focal
pneumonia, collapse or consolidation. Negative for edema, effusion
or pneumothorax. Trachea central airways are patent.

Upper Abdomen: No acute abnormality.

Musculoskeletal: Degenerative changes of the spine. No acute osseous
finding. Sternum intact.

Review of the MIP images confirms the above findings.
IMPRESSION: No significant acute pulmonary embolus by CTA.

No other acute intrathoracic finding

Minor basilar atelectasis

4.2 cm right inferior thyroid mass. Recommend follow-up thyroid
ultrasound non emergently.

## 2019-11-14 ENCOUNTER — Telehealth: Payer: BLUE CROSS/BLUE SHIELD | Admitting: Nurse Practitioner

## 2019-11-14 DIAGNOSIS — M545 Low back pain, unspecified: Secondary | ICD-10-CM

## 2019-11-14 MED ORDER — TIZANIDINE HCL 2 MG PO CAPS: 2.0000 mg | ORAL_CAPSULE | Freq: Three times a day (TID) | ORAL | 0 refills | Status: DC

## 2019-11-14 MED ORDER — NAPROXEN 500 MG PO TABS: 500.0000 mg | ORAL_TABLET | Freq: Two times a day (BID) | ORAL | 0 refills | Status: DC

## 2019-11-14 NOTE — Progress Notes (Signed)

## 2020-01-07 ENCOUNTER — Telehealth: Payer: BLUE CROSS/BLUE SHIELD | Admitting: Nurse Practitioner

## 2020-01-07 DIAGNOSIS — M545 Low back pain, unspecified: Secondary | ICD-10-CM

## 2020-01-07 NOTE — Progress Notes (Signed)
Based on what you shared with me it looks like you have back pain,that should be evaluated in a face to face office visit.  You did an evisit at end of January with same complaint that was treated with naprosyn and muscle relaxor. If this has reoccurred then you wil need a face to face visit in order for proper treatment.    NOTE: If you entered your credit card information for this eVisit, you will not be charged. You may see a "hold" on your card for the $35 but that hold will drop off and you will not have a charge processed.  If you are having a true medical emergency please call 911.     For an urgent face to face visit, [facility] has four urgent care centers for your convenience:   . [facility]    [phone]                  Get Driving Directions  [id number] [place], [place] [address] . 10 am to 8 pm Monday-Friday . 12 pm to 8 pm Saturday-Sunday   . [facility] at [place]                  Get Driving Directions  [id number] [place], [place], [place] [address] . 8 am to 8 pm Monday-Friday . 9 am to 6 pm Saturday . 11 am to 6 pm Sunday   . [facility] at [place]                  Get Driving Directions   [address], [place] [address] . 8 am to 8 pm Monday-Friday . 8 am to 4 pm Saturday-Sunday    . [facility] at [place]                    Get Driving Directions  [id number]  [address] [place], [place] [address]  . Monday-Friday, 12 PM to 6 PM    Your e-visit answers were reviewed by a board certified advanced clinical practitioner to complete your personal care plan.  Thank you for using e-Visits.

## 2020-06-14 ENCOUNTER — Ambulatory Visit
Admission: EM | Admit: 2020-06-14 | Discharge: 2020-06-14 | Disposition: A | Discharge status: Home / Self Care | Payer: BLUE CROSS/BLUE SHIELD | Attending: Emergency Medicine | Admitting: Emergency Medicine

## 2020-06-14 ENCOUNTER — Other Ambulatory Visit: Payer: Self-pay

## 2020-06-14 DIAGNOSIS — N23 Unspecified renal colic: Secondary | ICD-10-CM

## 2020-06-14 LAB — POCT URINALYSIS DIP (MANUAL ENTRY)
Bilirubin, UA: NEGATIVE
Glucose, UA: NEGATIVE mg/dL
Ketones, POC UA: NEGATIVE mg/dL
Leukocytes, UA: NEGATIVE
Nitrite, UA: NEGATIVE
Protein Ur, POC: NEGATIVE mg/dL
Spec Grav, UA: 1.02 (ref 1.010–1.025)
Urobilinogen, UA: 0.2 E.U./dL
pH, UA: 6 (ref 5.0–8.0)

## 2020-06-14 MED ORDER — KETOROLAC TROMETHAMINE 30 MG/ML IJ SOLN
30.0000 mg | Freq: Once | INTRAMUSCULAR | Status: AC
  Administered 2020-06-14: 30 mg via INTRAMUSCULAR

## 2020-06-14 NOTE — ED Triage Notes (Addendum)
Pt c/o right flank pain x 2 weeks, taking "Goody powder" at home with minimal relief, patient also had increased urination yesterday. Has been seen in ER and virtual visit for same pain.

## 2020-06-14 NOTE — ED Provider Notes (Signed)
[facility]    CSN: [id number] Arrival date & time: 06/14/20  1523      History   Chief Complaint Chief Complaint  Patient presents with  . Flank Pain    HPI Alicia Brandt is a 59 y.o. female presenting for 2-week course of intermittent right flank pain.  Patient unsure if she has aggravating or relieving factors.  Has tried Goody powder at home without relief.  States she is also noticed urinary frequency with decreased output.  Denies hematuria, burning with urination, urgency, incontinence.  No abdominal pain, fever.  Denies musculoskeletal strain or injury.  Denies history of kidney stones.    History reviewed. No pertinent past medical history.  Patient Active Problem List   Diagnosis Date Noted  . ARTHRALGIA 01/12/2008  . 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 OTHER ABNORMAL GLUCOSE 09/29/2007  . GERD 08/05/2007  . ABSCESS, VULVA NEC 08/05/2007  . ARTHROPATHY NOS, MULTIPLE SITES 08/05/2007    Past Surgical History:  Procedure Laterality Date  . TUBAL LIGATION      OB History   No obstetric history on file.      Home Medications    Prior to Admission medications   Not on File    Family History Family History  Problem Relation Age of Onset  . Cancer Mother   . Diabetes Brother     Social History Social History   Tobacco Use  . Smoking status: Current Every Day Smoker    Packs/day: 0.50    Types: Cigarettes  . Smokeless tobacco: Never Used  Substance Use Topics  . Alcohol use: No  . Drug use: No     Allergies   Patient has no known allergies.   Review of Systems As per HPI   Physical Exam Triage Vital Signs ED Triage Vitals [06/14/20 1602]  Enc Vitals Group     BP      Pulse      Resp      Temp      Temp src      SpO2      Weight      Height      Head Circumference      Peak Flow      Pain Score 10     Pain Loc      Pain Edu?      Excl. in [place]?    No data found.  Updated Vital  Signs LMP 05/05/2014 (Approximate)   Visual Acuity Right Eye Distance:   Left Eye Distance:   Bilateral Distance:    Right Eye Near:   Left Eye Near:    Bilateral Near:     Physical Exam Constitutional:      General: She is not in acute distress. HENT:     Head: Normocephalic and atraumatic.     Mouth/Throat:     Mouth: Mucous membranes are moist.     Pharynx: Oropharynx is clear.  Eyes:     General: No scleral icterus.    Pupils: Pupils are equal, round, and reactive to light.  Cardiovascular:     Rate and Rhythm: Normal rate and regular rhythm.  Pulmonary:     Effort: Pulmonary effort is normal. No respiratory distress.     Breath sounds: No wheezing.  Abdominal:     Palpations: Abdomen is soft.     Tenderness: There is no abdominal tenderness. There is right CVA tenderness. There is no left CVA tenderness.  Musculoskeletal:  General: No swelling. Normal range of motion.  Skin:    Capillary Refill: Capillary refill takes less than 2 seconds.     Coloration: Skin is not jaundiced or pale.     Findings: No bruising or rash.  Neurological:     Mental Status: She is alert and oriented to person, place, and time.      UC Treatments / Results  Labs (all labs ordered are listed, but only abnormal results are displayed) Labs Reviewed  POCT URINALYSIS DIP (MANUAL ENTRY) - Abnormal; Notable for the following components:      Result Value   Blood, UA moderate (*)    All other components within normal limits    EKG   Radiology No results found.  Procedures Procedures (including critical care time)  Medications Ordered in UC Medications  ketorolac (TORADOL) 30 MG/ML injection 30 mg (30 mg Intramuscular Given 06/14/20 1719)    Initial Impression / Assessment and Plan / UC Course  I have reviewed the triage vital signs and the nursing notes.  Pertinent labs & imaging results that were available during my care of the patient were reviewed by me and  considered in my medical decision making (see chart for details).     Patient febrile, nontoxic in office today.  Urine dipstick with moderate blood.  H&P concerning for renal colic: Toradol given in office.  Will push fluids, use strainer, follow-up with PCP as needed.  Return precautions discussed, pt verbalized understanding and is agreeable to plan. Final Clinical Impressions(s) / UC Diagnoses   Final diagnoses:  Renal colic on right side     Discharge Instructions     Please read handout on renal colic. You were given a toradol (pain) shot. Drink plenty of water throughout the day and use strainer when you urinate. Important to follow up with primary care. Go to ER for worsening pain, difficulty breathing, vomiting, or fever.    ED Prescriptions    None     I have reviewed the PDMP during this encounter.   [place], [place], [place] 06/14/20 1731

## 2020-06-14 NOTE — Discharge Instructions (Addendum)
Please read handout on renal colic. You were given a toradol (pain) shot. Drink plenty of water throughout the day and use strainer when you urinate. Important to follow up with primary care. Go to ER for worsening pain, difficulty breathing, vomiting, or fever.

## 2020-07-04 ENCOUNTER — Emergency Department (HOSPITAL_COMMUNITY): Payer: BLUE CROSS/BLUE SHIELD

## 2020-07-04 ENCOUNTER — Other Ambulatory Visit: Payer: Self-pay

## 2020-07-04 ENCOUNTER — Encounter (HOSPITAL_COMMUNITY): Payer: Self-pay | Admitting: Emergency Medicine

## 2020-07-04 ENCOUNTER — Emergency Department (HOSPITAL_COMMUNITY)
Admission: EM | Admit: 2020-07-04 | Discharge: 2020-07-04 | Disposition: A | Discharge status: Home / Self Care | Payer: BLUE CROSS/BLUE SHIELD | Attending: Emergency Medicine | Admitting: Emergency Medicine

## 2020-07-04 DIAGNOSIS — R1032 Left lower quadrant pain: Secondary | ICD-10-CM | POA: Insufficient documentation

## 2020-07-04 DIAGNOSIS — F17210 Nicotine dependence, cigarettes, uncomplicated: Secondary | ICD-10-CM | POA: Insufficient documentation

## 2020-07-04 DIAGNOSIS — R109 Unspecified abdominal pain: Secondary | ICD-10-CM

## 2020-07-04 DIAGNOSIS — N939 Abnormal uterine and vaginal bleeding, unspecified: Secondary | ICD-10-CM

## 2020-07-04 DIAGNOSIS — N950 Postmenopausal bleeding: Secondary | ICD-10-CM | POA: Diagnosis not present

## 2020-07-04 LAB — HEPATIC FUNCTION PANEL
ALT: 12 U/L (ref 0–44)
AST: 13 U/L — ABNORMAL LOW (ref 15–41)
Albumin: 3.5 g/dL (ref 3.5–5.0)
Alkaline Phosphatase: 57 U/L (ref 38–126)
Bilirubin, Direct: <0.1 mg/dL (ref 0.0–0.2)
Total Bilirubin: 0.4 mg/dL (ref 0.3–1.2)
Total Protein: 8.1 g/dL (ref 6.5–8.1)

## 2020-07-04 LAB — BASIC METABOLIC PANEL
Anion gap: 10 (ref 5–15)
BUN: 11 mg/dL (ref 6–20)
CO2: 22 mmol/L (ref 22–32)
Calcium: 9.5 mg/dL (ref 8.9–10.3)
Chloride: 105 mmol/L (ref 98–111)
Creatinine, Ser: 0.61 mg/dL (ref 0.44–1.00)
GFR calc Af Amer: >60 mL/min (ref >60)
GFR calc non Af Amer: >60 mL/min (ref >60)
Glucose, Bld: 107 mg/dL — ABNORMAL HIGH (ref 70–99)
Potassium: 3.9 mmol/L (ref 3.5–5.1)
Sodium: 137 mmol/L (ref 135–145)

## 2020-07-04 LAB — APTT: aPTT: 32 seconds (ref 24–36)

## 2020-07-04 LAB — I-STAT BETA HCG BLOOD, ED (MC, WL, AP ONLY): I-stat hCG, quantitative: <5 m[IU]/mL (ref <5)

## 2020-07-04 LAB — URINALYSIS, ROUTINE W REFLEX MICROSCOPIC
Bilirubin Urine: NEGATIVE
Glucose, UA: NEGATIVE mg/dL
Ketones, ur: NEGATIVE mg/dL
Leukocytes,Ua: NEGATIVE
Nitrite: NEGATIVE
Protein, ur: NEGATIVE mg/dL
Specific Gravity, Urine: 1.041 — ABNORMAL HIGH (ref 1.005–1.030)
pH: 5 (ref 5.0–8.0)

## 2020-07-04 LAB — CBC
HCT: 37.6 % (ref 36.0–46.0)
Hemoglobin: 12.2 g/dL (ref 12.0–15.0)
MCH: 28.4 pg (ref 26.0–34.0)
MCHC: 32.4 g/dL (ref 30.0–36.0)
MCV: 87.4 fL (ref 80.0–100.0)
Platelets: 407 10*3/uL — ABNORMAL HIGH (ref 150–400)
RBC: 4.3 MIL/uL (ref 3.87–5.11)
RDW: 13.7 % (ref 11.5–15.5)
WBC: 15.2 10*3/uL — ABNORMAL HIGH (ref 4.0–10.5)
nRBC: 0 % (ref 0.0–0.2)

## 2020-07-04 LAB — LIPASE, BLOOD: Lipase: 35 U/L (ref 11–51)

## 2020-07-04 LAB — PROTIME-INR
INR: 1.1 (ref 0.8–1.2)
Prothrombin Time: 13.4 seconds (ref 11.4–15.2)

## 2020-07-04 LAB — WET PREP, GENITAL
Clue Cells Wet Prep HPF POC: NONE SEEN
Sperm: NONE SEEN
Trich, Wet Prep: NONE SEEN
Yeast Wet Prep HPF POC: NONE SEEN

## 2020-07-04 LAB — POC OCCULT BLOOD, ED: Fecal Occult Bld: NEGATIVE

## 2020-07-04 MED ORDER — MORPHINE SULFATE (PF) 4 MG/ML IV SOLN
4.0000 mg | Freq: Once | INTRAVENOUS | Status: AC
  Administered 2020-07-04: 4 mg via INTRAVENOUS
  Filled 2020-07-04: qty 1

## 2020-07-04 MED ORDER — SODIUM CHLORIDE 0.9 % IV BOLUS
1000.0000 mL | Freq: Once | INTRAVENOUS | Status: AC
  Administered 2020-07-04: 1000 mL via INTRAVENOUS

## 2020-07-04 MED ORDER — IOHEXOL 300 MG/ML  SOLN
100.0000 mL | Freq: Once | INTRAMUSCULAR | Status: AC | PRN
  Administered 2020-07-04: 100 mL via INTRAVENOUS

## 2020-07-04 NOTE — ED Triage Notes (Signed)
Pt reports bilateral flank pn w/ the left being worse than the right.  Pt states "not much helps except for the "bc powder."  Pt reports this has been going on for a month, denies any other symptoms.

## 2020-07-04 NOTE — Discharge Instructions (Signed)
Please follow-up with your OB/GYN.  If you do not have an OB/GYN I recommend following up with the women's med Center here at [facility] or at [facility].  I have attached the information through CT scan.  Please call to make an appointment with your OB/GYN within the next week or 2.  Please use Tylenol or ibuprofen for pain.  You may use 600 mg ibuprofen every 6 hours or 1000 mg of Tylenol every 6 hours.  You may choose to alternate between the 2.  This would be most effective.  Not to exceed 4 g of Tylenol within 24 hours.  Not to exceed 3200 mg ibuprofen 24 hours.

## 2020-07-04 NOTE — ED Provider Notes (Signed)
[facility] EMERGENCY DEPARTMENT Provider Note   CSN: [id number] Arrival date & time: 07/04/20  8676     History Chief Complaint  Patient presents with  . Flank Pain    Alicia Brandt is a 59 y.o. female.  HPI  Patient is a 59 year old female with a history of acid reflux   Patient is presented today with 2.5 months of left flank pain she states that it is sharp, stabbing and present more than it is absent although it is intermittent.  She states she was having some right-sided flank pain at one point but this has resolved.  She denies any nausea, vomiting, changes in her bowel movements.  She states however she has noticed some blood when she is wiping.  She states she is fairly sure that it is coming from her rectum and it is blood clots however she states that she has also noticed some vaginal bleeding.  She states this is been going on for 3 or 4 months.  She denies any weight loss or change in appetite any night sweats fevers lightheadedness or dizziness.  She denies any chest pain.  She states that she has family members with history of breast cancer she is not a cancer patient herself.  She denies any other significant past medical history.   Apart from vaginal bleeding she denies any other vaginal discharge.  She denies any recent sexual intercourse.  Denies any urinary symptoms such as frequency, urgency, dysuria.  History reviewed. No pertinent past medical history.  Patient Active Problem List   Diagnosis Date Noted  . ARTHRALGIA 01/12/2008  . 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 OTHER ABNORMAL GLUCOSE 09/29/2007  . GERD 08/05/2007  . ABSCESS, VULVA NEC 08/05/2007  . ARTHROPATHY NOS, MULTIPLE SITES 08/05/2007    Past Surgical History:  Procedure Laterality Date  . TUBAL LIGATION       OB History   No obstetric history on file.     Family History  Problem Relation Age of Onset  . Cancer Mother   . Diabetes Brother      Social History   Tobacco Use  . Smoking status: Current Every Day Smoker    Packs/day: 0.50    Types: Cigarettes  . Smokeless tobacco: Never Used  Substance Use Topics  . Alcohol use: No  . Drug use: No    Home Medications Prior to Admission medications   Not on File    Allergies    Patient has no known allergies.  Review of Systems   Review of Systems  Constitutional: Negative for chills and fever.  HENT: Negative for congestion.   Eyes: Negative for pain.  Respiratory: Negative for cough and shortness of breath.   Cardiovascular: Negative for chest pain and leg swelling.  Gastrointestinal: Positive for abdominal pain. Negative for vomiting.  Genitourinary: Positive for flank pain. Negative for dysuria.  Musculoskeletal: Negative for myalgias.  Skin: Negative for rash.  Neurological: Negative for dizziness and headaches.    Physical Exam Updated Vital Signs BP 128/70   Pulse 63   Temp 98.5 F (36.9 C) (Oral)   Resp 16   Ht 5\' 5"  (1.651 m)   Wt 84.4 kg   LMP 05/05/2014 (Approximate)   SpO2 98%   BMI 30.95 kg/m   Physical Exam Vitals and nursing note reviewed.  Constitutional:      General: She is not in acute distress.    Appearance: She is obese.     Comments:  Uncomfortable appearing 59 year old female, pleasant, able answer questions appropriately follow commands  HENT:     Head: Normocephalic and atraumatic.     Nose: Nose normal.     Mouth/Throat:     Mouth: Mucous membranes are moist.  Eyes:     General: No scleral icterus. Cardiovascular:     Rate and Rhythm: Normal rate and regular rhythm.     Pulses: Normal pulses.     Heart sounds: Normal heart sounds.  Pulmonary:     Effort: Pulmonary effort is normal. No respiratory distress.     Breath sounds: Normal breath sounds. No wheezing.  Chest:     Chest wall: No tenderness.  Abdominal:     Palpations: Abdomen is soft.     Tenderness: There is abdominal tenderness. There is no right CVA  tenderness, left CVA tenderness, guarding or rebound.     Comments: Left lower quadrant tenderness with palpation.  Genitourinary:    Rectum: Normal.     Comments: Vulva without lesions or abnormality Vaginal canal without abnormal discharge or lesion Cervix w friable tissue at 5 o'clock position that bleeds with swab.  No adnexal tenderness or CMT  Guaic negative; soft brown stool Musculoskeletal:     Cervical back: Normal range of motion.     Right lower leg: No edema.     Left lower leg: No edema.  Skin:    General: Skin is warm and dry.     Capillary Refill: Capillary refill takes less than 2 seconds.  Neurological:     Mental Status: She is alert. Mental status is at baseline.  Psychiatric:        Mood and Affect: Mood normal.        Behavior: Behavior normal.     ED Results / Procedures / Treatments   Labs (all labs ordered are listed, but only abnormal results are displayed) Labs Reviewed  WET PREP, GENITAL - Abnormal; Notable for the following components:      Result Value   WBC, Wet Prep HPF POC MANY (*)    All other components within normal limits  URINALYSIS, ROUTINE W REFLEX MICROSCOPIC - Abnormal; Notable for the following components:   Color, Urine STRAW (*)    Specific Gravity, Urine 1.041 (*)    Hgb urine dipstick MODERATE (*)    Bacteria, UA RARE (*)    All other components within normal limits  BASIC METABOLIC PANEL - Abnormal; Notable for the following components:   Glucose, Bld 107 (*)    All other components within normal limits  CBC - Abnormal; Notable for the following components:   WBC 15.2 (*)    Platelets 407 (*)    All other components within normal limits  HEPATIC FUNCTION PANEL - Abnormal; Notable for the following components:   AST 13 (*)    All other components within normal limits  PROTIME-INR  APTT  LIPASE, BLOOD  I-STAT BETA HCG BLOOD, ED ([facility], [facility], [facility] ONLY)  POC OCCULT BLOOD, ED  GC/CHLAMYDIA PROBE AMP ([place]) NOT AT [facility]     EKG EKG Interpretation  Date/Time:  Monday July 04 2020 07:10:38 EDT Ventricular Rate:  68 PR Interval:  140 QRS Duration: 72 QT Interval:  386 QTC Calculation: 410 R Axis:   50 Text Interpretation: Normal sinus rhythm Normal ECG When compared to prior, similar apperance. No STEMI Confirmed by Antony Blackbird [id number]) on 07/04/2020 11:13:55 AM   Radiology CT ABDOMEN PELVIS W CONTRAST  Result Date: 07/04/2020 CLINICAL  DATA:  Left lower quadrant abdominal pain with bloody stool. Clinical concern for diverticulitis and nephrolithiasis. EXAM: CT ABDOMEN AND PELVIS WITH CONTRAST TECHNIQUE: Multidetector CT imaging of the abdomen and pelvis was performed using the standard protocol following bolus administration of intravenous contrast. CONTRAST:  126mL OMNIPAQUE IOHEXOL 300 MG/ML  SOLN COMPARISON:  Limited correlation made with chest CTA 04/05/2019 FINDINGS: Lower chest: Clear lung bases. No significant pleural or pericardial effusion. Hepatobiliary: Tiny low-density lesions within the right hepatic lobe on images 14 and 31 of series 3 are likely cysts. No suspicious hepatic findings. No evidence of gallstones, gallbladder wall thickening or biliary dilatation. Pancreas: Unremarkable. No pancreatic ductal dilatation or surrounding inflammatory changes. Spleen: Normal in size without focal abnormality. Adrenals/Urinary Tract: Both adrenal glands appear normal. No evidence of urinary tract calculus, hydronephrosis or perinephric soft tissue stranding. There are subcentimeter low-density renal lesions bilaterally, likely cysts. The bladder appears unremarkable. Stomach/Bowel: No evidence of bowel wall thickening, distention or surrounding inflammatory change. The appendix appears normal. Mild sigmoid diverticulosis without evidence of acute inflammation. Vascular/Lymphatic: There are no enlarged abdominal or pelvic lymph nodes. Aortic and branch vessel atherosclerosis. No acute vascular findings. The  portal, superior mesenteric and splenic veins are patent. Reproductive: No adnexal mass. The endometrial canal is distended to 13 mm. Asymmetric soft tissue fullness posteriorly in the uterine body may reflect a fibroid. There is nonspecific soft tissue fullness of the cervix. No surrounding inflammatory changes. Other: No evidence of abdominal wall mass or hernia. No ascites. Musculoskeletal: No acute or significant osseous findings. IMPRESSION: 1. No acute findings or clear explanation for the patient's symptoms. No evidence of urinary tract calculus or hydronephrosis. 2. Mild sigmoid diverticulosis without evidence of acute inflammation. 3. Nonspecific soft tissue fullness of the cervix with distension endometrial canal, potentially reflecting a cervical or endometrial mass in this presumably postmenopausal patient. Correlation with GYN examination recommended. Pelvic ultrasound may be helpful for further evaluation. 4. Aortic Atherosclerosis (ICD10-I70.0). Electronically Signed   By: Richardean Sale M.D.   On: 07/04/2020 13:27    Procedures Procedures (including critical care time)  Medications Ordered in ED Medications  sodium chloride 0.9 % bolus 1,000 mL (0 mLs Intravenous Stopped 07/04/20 1543)  morphine 4 MG/ML injection 4 mg (4 mg Intravenous Given 07/04/20 1149)  iohexol (OMNIPAQUE) 300 MG/ML solution 100 mL (100 mLs Intravenous Contrast Given 07/04/20 1305)    ED Course  I have reviewed the triage vital signs and the nursing notes.  Pertinent labs & imaging results that were available during my care of the patient were reviewed by me and considered in my medical decision making (see chart for details).    MDM Rules/Calculators/A&P                          Patient is a 59 year old female with past medical history detailed above presented today for left flank pain, some vaginal bleeding--postmenopausal.  Physical exam is notable for left lower quadrant tenderness to palpation and some  cervical friability on examination with some bleeding that appears to be coming from a closed cervix.  Given the patient's history and physical exam concern for endometrial cancer versus cervical cancer versus diverticulitis versus UTI versus lower GI bleed.  Pelvic exam is concerning for possible endometrial cancer given the slow bleeding from the close cervix.  CBC with mild leukocytosis 13.2.  Platelets mildly elevated may be secondary to hemoconcentration.  LFTs within normal limits.  BMP without any electrolyte  dyscrasia.  I-STAT hCG within normal limits she is not pregnant.  Is also postmenopausal.  Urinalysis with mildly increased specific gravity consistent dehydration also moderate hemoglobin likely contamination from vaginal canal.  Her bacteria no evidence of infection.  Lipase, coags within normal limits and fecal occult blood card negative.   CT scan results below: IMPRESSION: 1. No acute findings or clear explanation for the patient's symptoms. No evidence of urinary tract calculus or hydronephrosis. 2. Mild sigmoid diverticulosis without evidence of acute inflammation. 3. Nonspecific soft tissue fullness of the cervix with distension endometrial canal, potentially reflecting a cervical or endometrial mass in this presumably postmenopausal patient. Correlation with GYN examination recommended. Pelvic ultrasound may be helpful for further evaluation. 4. Aortic Atherosclerosis (ICD10-I70.0). Electronically Signed   By: Richardean Sale M.D.   On: 07/04/2020 13:27    No acute intra-abdominal pathology to explain patient's symptoms today.  She does not appear to have diverticulitis although there is evidence of diverticulosis.  Fecal occult stool was negative.  I suspect the patient noticing blood per rectum was perhaps actually vaginal in origin.  I discussed this case with my attending physician who cosigned this note including patient's presenting symptoms, physical exam, and planned  diagnostics and interventions. Attending physician stated agreement with plan or made changes to plan which were implemented.   Patient will follow up with OB/GYN.  I have concern for endometrial or cervical cancer.  She understands the need for close follow-up and states that she will have this arranged in the next week.  I provided patient with the [facility] for a number and address.  Reexamination patient is virtually pain-free.  Will discharge with OTC pain control.  Conservative management follow-up with OB/GYN.  She is tolerating p.o. and is able to ambulate without difficulty at this time.  Final Clinical Impression(s) / ED Diagnoses Final diagnoses:  Flank pain  Left flank pain  Abdominal pain, LLQ  Vaginal bleeding  Post-menopausal bleeding    Rx / DC Orders ED Discharge Orders    None       Tedd Sias, [place] 07/05/20 1505    Tegeler, Gwenyth Allegra, MD 07/05/20 [phone]

## 2020-07-04 NOTE — ED Notes (Signed)
Pt in CT.

## 2020-07-05 LAB — GC/CHLAMYDIA PROBE AMP (~~LOC~~) NOT AT ARMC
Chlamydia: NEGATIVE
Comment: NEGATIVE
Comment: NORMAL
Neisseria Gonorrhea: NEGATIVE

## 2020-08-03 ENCOUNTER — Other Ambulatory Visit: Payer: Self-pay

## 2020-08-03 ENCOUNTER — Other Ambulatory Visit (HOSPITAL_COMMUNITY)
Admission: RE | Admit: 2020-08-03 | Discharge: 2020-08-03 | Disposition: A | Discharge status: Home / Self Care | Payer: BLUE CROSS/BLUE SHIELD | Source: Ambulatory Visit | Attending: Family Medicine | Admitting: Family Medicine

## 2020-08-03 ENCOUNTER — Ambulatory Visit (INDEPENDENT_AMBULATORY_CARE_PROVIDER_SITE_OTHER): Payer: BLUE CROSS/BLUE SHIELD | Admitting: Family Medicine

## 2020-08-03 ENCOUNTER — Encounter: Payer: Self-pay | Admitting: Family Medicine

## 2020-08-03 VITALS — BP 136/54 | HR 67 | Resp 16 | Ht 64.0 in | Wt 171.0 lb

## 2020-08-03 DIAGNOSIS — N950 Postmenopausal bleeding: Secondary | ICD-10-CM | POA: Insufficient documentation

## 2020-08-03 DIAGNOSIS — R9389 Abnormal findings on diagnostic imaging of other specified body structures: Secondary | ICD-10-CM | POA: Diagnosis not present

## 2020-08-03 DIAGNOSIS — N888 Other specified noninflammatory disorders of cervix uteri: Secondary | ICD-10-CM

## 2020-08-03 NOTE — Progress Notes (Signed)
   Subjective:    Patient ID: Alicia Brandt, female    DOB: 1961/03/16, 59 y.o.   MRN: [id number]  HPI Patient presents with postmenopausal bleeding that started 2.5 months ago. Has daily bleeding when she uses the bathroom. Also noticed 10# weight loss in the past couple of months. Does have back pain and went to ED. Had vaginal exam there and EDP noted a friable cervix. On CT, thickened endometrial stripe.  Has not been to doctor in several years. Last PAP was many years ago.   I have reviewed the patients past medical, family, and social history.  I have reviewed the patient's medication list and allergies.   Review of Systems     Objective:   Physical Exam Vitals reviewed. Exam conducted with a chaperone present.  Constitutional:      Appearance: She is well-developed.  HENT:     Head: Normocephalic and atraumatic.  Cardiovascular:     Heart sounds: Normal heart sounds. No murmur heard.  No friction rub. No gallop.   Pulmonary:     Effort: Pulmonary effort is normal.     Breath sounds: Normal breath sounds.  Abdominal:     General: Bowel sounds are normal.     Hernia: No hernia is present. There is no hernia in the left inguinal area or right inguinal area.  Genitourinary:    Vagina: No signs of injury and foreign body. No vaginal discharge, erythema, tenderness or bleeding.        Comments: Bilateral labial thickening. Scars on thigh and buttocks from repetitive infections. Lymphadenopathy:     Lower Body: No right inguinal adenopathy. No left inguinal adenopathy.  Skin:    General: Skin is warm.     Capillary Refill: Capillary refill takes 2 to 3 seconds.  Neurological:     General: No focal deficit present.  Psychiatric:        Mood and Affect: Mood normal.    ENDOMETRIAL  BIOPSY The indications for endometrial biopsy were reviewed.   Risks of the biopsy including cramping, bleeding, infection, uterine perforation, inadequate specimen and need for additional  procedures  were discussed. The patient states she understands and agrees to undergo procedure today. Consent was signed. Time out was performed. Urine HCG was negative. A sterile speculum was placed in the patient's vagina and the cervix was prepped with Betadine. A single-toothed tenaculum was placed on the anterior lip of the cervix to stabilize it. The 3 mm pipelle was introduced into the endometrial cavity without difficulty to a depth of 9cm. Thick serous fluid was obtained during the first three passes. Some tissue was obtained on the last couple of passes. This was sent to pathology.   CERVICAL BIOPSY Cervical biopsies were also taken at 12 o'clock and 2 biopsies at 5o'clock. An ECC was also obtained. Monsel's solution was used for hemostasis.   The instruments were removed from the patient's vagina. Minimal bleeding from the cervix was noted. The patient tolerated the procedure well. Routine post-procedure instructions were given to the patient. The patient will follow up to review the results and for further management.       Assessment & Plan:  1. PMB (postmenopausal bleeding) High risk for cancer. - Surgical pathology( [place]/ [facility]) - Surgical pathology( [place]/ [facility]) - Surgical pathology( [place]/ [facility])  2. Cervical mass Biopsies taken. Concerned for cervical cancer. F/u in 1-2 weeks for results  3. Thickened endometrium Concerning for endometrial cancer. Endometrial biopsy taken.

## 2020-08-05 LAB — SURGICAL PATHOLOGY

## 2020-08-09 ENCOUNTER — Telehealth: Payer: Self-pay

## 2020-08-09 NOTE — Telephone Encounter (Signed)
Called patient in regards to getting  Her in to see Dr. Nehemiah Settle for results. Kathrene Alu RN

## 2020-08-10 ENCOUNTER — Encounter: Payer: BLUE CROSS/BLUE SHIELD | Admitting: Family Medicine

## 2020-08-18 ENCOUNTER — Encounter: Payer: Self-pay | Admitting: Family Medicine

## 2020-08-18 ENCOUNTER — Other Ambulatory Visit: Payer: Self-pay

## 2020-08-18 ENCOUNTER — Ambulatory Visit (INDEPENDENT_AMBULATORY_CARE_PROVIDER_SITE_OTHER): Payer: BLUE CROSS/BLUE SHIELD | Admitting: Family Medicine

## 2020-08-18 VITALS — BP 140/72 | HR 66 | Ht 64.0 in | Wt 167.1 lb

## 2020-08-18 DIAGNOSIS — C539 Malignant neoplasm of cervix uteri, unspecified: Secondary | ICD-10-CM

## 2020-08-18 NOTE — Progress Notes (Signed)
   Subjective:    Patient ID: Alicia Brandt, female    DOB: 04-28-1961, 59 y.o.   MRN: [id number]  HPI Patient here with daughter. Patient doing well after biopsy. She had a little bleeding with black discharge from the monsel's for a few days. No further bleeding.    Review of Systems     Objective:   Physical Exam Vitals reviewed.  Skin:    Capillary Refill: Capillary refill takes less than 2 seconds.  Neurological:     Mental Status: She is alert.  Psychiatric:        Mood and Affect: Mood normal.        Thought Content: Thought content normal.        Judgment: Judgment normal.       Assessment & Plan:  1. 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 Likely primary cervical cancer. Discussed next steps: referral to gyn/onc. Discussed that she will need to have surgery to remove uterus, cervix. Prognosis depends on results. Offered support. Will arrange appt.

## 2020-08-22 ENCOUNTER — Telehealth: Payer: Self-pay

## 2020-08-22 ENCOUNTER — Telehealth: Payer: Self-pay | Admitting: *Deleted

## 2020-08-22 ENCOUNTER — Encounter (HOSPITAL_BASED_OUTPATIENT_CLINIC_OR_DEPARTMENT_OTHER): Payer: Self-pay | Admitting: Obstetrics & Gynecology

## 2020-08-22 ENCOUNTER — Other Ambulatory Visit (HOSPITAL_COMMUNITY)
Admission: RE | Admit: 2020-08-22 | Discharge: 2020-08-22 | Disposition: A | Discharge status: Home / Self Care | Payer: BLUE CROSS/BLUE SHIELD | Source: Ambulatory Visit | Attending: Obstetrics & Gynecology | Admitting: Obstetrics & Gynecology

## 2020-08-22 ENCOUNTER — Other Ambulatory Visit: Payer: Self-pay

## 2020-08-22 DIAGNOSIS — Z20822 Contact with and (suspected) exposure to covid-19: Secondary | ICD-10-CM | POA: Diagnosis not present

## 2020-08-22 DIAGNOSIS — Z01818 Encounter for other preprocedural examination: Secondary | ICD-10-CM | POA: Insufficient documentation

## 2020-08-22 NOTE — Telephone Encounter (Signed)
Called and scheduled the patient for a new patient appt on 12/3 at 11:15 am with Dr Berline Lopes. Pushed the appt out due to patient having surgery and physician shortage in clinic. Patient given the address and phone number for the clinic. Patient also given policy for mask and visitors

## 2020-08-22 NOTE — Progress Notes (Signed)
Spoke with Alicia Brandt, patient care coordinator for after main office number is closed.   She is messaging office to request orders for patient.

## 2020-08-22 NOTE — Progress Notes (Signed)
Spoke w/ via phone for pre-op interview---pt Lab needs dos----none               Lab results------none COVID test ------08-22-2020 1500 Arrive at -------1300 08-23-2020 NPO after MN NO Solid Food.  Clear liquids from MN until---1200 pm then npo Medications to take morning of surgery -----none Diabetic medication -----n/a Patient Special Instructions -----none Pre-Op special Istructions -----none Patient verbalized understanding of instructions that were given at this phone interview. Patient denies shortness of breath, chest pain, fever, cough at this phone interview.

## 2020-08-22 NOTE — Progress Notes (Signed)
Spoke with Hilda Blades, patient care coordinator for Dr. Ihor Dow, requesting orders for patient. She sent out a page to Dr. Maylene Roes Tamala Julian.

## 2020-08-22 NOTE — Telephone Encounter (Signed)
Called Levada Dy, no answer, left voicemail.  Called daughter, Laurene Footman, surgery information given. Surgery scheduled for 08/23/2020 @1500  at [facility], NPO after midnight, no lotion, no powder, no perfume, little it of deodorant is okay. Remove all jewelry, have someone available to drive you.  Need to be COVID tested today, testing is done at [facility] needs to be completed by 3p today.  Expressed understanding, advised to call the office back if she had any questions.

## 2020-08-22 NOTE — Telephone Encounter (Signed)
Patient called and left voicemail stating she was given my number due to she has been recently diagnosed with cervical cancer and that her insurance will not cover her surgery. Called patient back. Patient has [employer]. Patient is not eligible for [facility]CP due to biopsies have already been completed. Unable to do BCCCP navigation for Medicaid due to patient has insurance and is not eligible for Medicaid. Told patient about the [facility] Application and gave her the information to find the application on the [facility] website. Also, recommended that patient speak with a Development worker, community at [facility]. Patient verbalized understanding

## 2020-08-23 ENCOUNTER — Ambulatory Visit (HOSPITAL_BASED_OUTPATIENT_CLINIC_OR_DEPARTMENT_OTHER)
Admission: RE | Admit: 2020-08-23 | Discharge: 2020-08-23 | Disposition: A | Discharge status: Home / Self Care | Payer: BLUE CROSS/BLUE SHIELD | Attending: Obstetrics & Gynecology | Admitting: Obstetrics & Gynecology

## 2020-08-23 ENCOUNTER — Ambulatory Visit (HOSPITAL_BASED_OUTPATIENT_CLINIC_OR_DEPARTMENT_OTHER): Payer: BLUE CROSS/BLUE SHIELD | Admitting: Certified Registered"

## 2020-08-23 ENCOUNTER — Encounter (HOSPITAL_BASED_OUTPATIENT_CLINIC_OR_DEPARTMENT_OTHER)
Admission: RE | Disposition: A | Discharge status: Home / Self Care | Payer: Self-pay | Source: Home / Self Care | Attending: Obstetrics & Gynecology

## 2020-08-23 ENCOUNTER — Encounter (HOSPITAL_BASED_OUTPATIENT_CLINIC_OR_DEPARTMENT_OTHER): Payer: Self-pay | Admitting: Obstetrics & Gynecology

## 2020-08-23 ENCOUNTER — Other Ambulatory Visit: Payer: Self-pay

## 2020-08-23 DIAGNOSIS — N858 Other specified noninflammatory disorders of uterus: Secondary | ICD-10-CM

## 2020-08-23 DIAGNOSIS — Z809 Family history of malignant neoplasm, unspecified: Secondary | ICD-10-CM | POA: Insufficient documentation

## 2020-08-23 DIAGNOSIS — D069 Carcinoma in situ of cervix, unspecified: Secondary | ICD-10-CM | POA: Diagnosis not present

## 2020-08-23 DIAGNOSIS — I700 Atherosclerosis of aorta: Secondary | ICD-10-CM | POA: Diagnosis not present

## 2020-08-23 DIAGNOSIS — Z833 Family history of diabetes mellitus: Secondary | ICD-10-CM | POA: Insufficient documentation

## 2020-08-23 DIAGNOSIS — N950 Postmenopausal bleeding: Secondary | ICD-10-CM

## 2020-08-23 DIAGNOSIS — K219 Gastro-esophageal reflux disease without esophagitis: Secondary | ICD-10-CM | POA: Diagnosis not present

## 2020-08-23 DIAGNOSIS — N888 Other specified noninflammatory disorders of cervix uteri: Secondary | ICD-10-CM

## 2020-08-23 DIAGNOSIS — F17210 Nicotine dependence, cigarettes, uncomplicated: Secondary | ICD-10-CM | POA: Insufficient documentation

## 2020-08-23 DIAGNOSIS — E0590 Thyrotoxicosis, unspecified without thyrotoxic crisis or storm: Secondary | ICD-10-CM | POA: Diagnosis not present

## 2020-08-23 DIAGNOSIS — R7309 Other abnormal glucose: Secondary | ICD-10-CM

## 2020-08-23 DIAGNOSIS — N879 Dysplasia of cervix uteri, unspecified: Secondary | ICD-10-CM

## 2020-08-23 HISTORY — PX: DILATION AND CURETTAGE OF UTERUS: SHX78

## 2020-08-23 HISTORY — DX: Thyrotoxicosis, unspecified without thyrotoxic crisis or storm: E05.90

## 2020-08-23 HISTORY — DX: Abnormal uterine and vaginal bleeding, unspecified: N93.9

## 2020-08-23 HISTORY — DX: Other specified noninflammatory disorders of cervix uteri: N88.8

## 2020-08-23 HISTORY — DX: Dysplasia of cervix uteri, unspecified: N87.9

## 2020-08-23 LAB — CBC
HCT: 36.9 % (ref 36.0–46.0)
Hemoglobin: 12.4 g/dL (ref 12.0–15.0)
MCH: 28.9 pg (ref 26.0–34.0)
MCHC: 33.6 g/dL (ref 30.0–36.0)
MCV: 86 fL (ref 80.0–100.0)
Platelets: 499 10*3/uL — ABNORMAL HIGH (ref 150–400)
RBC: 4.29 MIL/uL (ref 3.87–5.11)
RDW: 14 % (ref 11.5–15.5)
WBC: 12.8 10*3/uL — ABNORMAL HIGH (ref 4.0–10.5)
nRBC: 0 % (ref 0.0–0.2)

## 2020-08-23 LAB — SARS CORONAVIRUS 2 (TAT 6-24 HRS): SARS Coronavirus 2: NEGATIVE

## 2020-08-23 SURGERY — DILATION AND CURETTAGE
Anesthesia: General | Site: Vagina

## 2020-08-23 MED ORDER — MIDAZOLAM HCL 2 MG/2ML IJ SOLN
INTRAMUSCULAR | Status: AC
  Filled 2020-08-23: qty 2

## 2020-08-23 MED ORDER — MIDAZOLAM HCL 5 MG/5ML IJ SOLN
INTRAMUSCULAR | Status: DC | PRN
  Administered 2020-08-23: 2 mg via INTRAVENOUS

## 2020-08-23 MED ORDER — LIDOCAINE 2% (20 MG/ML) 5 ML SYRINGE
INTRAMUSCULAR | Status: AC
  Filled 2020-08-23: qty 5

## 2020-08-23 MED ORDER — LIDOCAINE 2% (20 MG/ML) 5 ML SYRINGE
INTRAMUSCULAR | Status: DC | PRN
  Administered 2020-08-23: 80 mg via INTRAVENOUS

## 2020-08-23 MED ORDER — PROPOFOL 10 MG/ML IV BOLUS
INTRAVENOUS | Status: DC | PRN
  Administered 2020-08-23: 160 mg via INTRAVENOUS

## 2020-08-23 MED ORDER — PROPOFOL 10 MG/ML IV BOLUS
INTRAVENOUS | Status: AC
  Filled 2020-08-23: qty 20

## 2020-08-23 MED ORDER — ONDANSETRON HCL 4 MG/2ML IJ SOLN
INTRAMUSCULAR | Status: AC
  Filled 2020-08-23: qty 2

## 2020-08-23 MED ORDER — ONDANSETRON HCL 4 MG/2ML IJ SOLN
INTRAMUSCULAR | Status: DC | PRN
  Administered 2020-08-23: 4 mg via INTRAVENOUS

## 2020-08-23 MED ORDER — KETOROLAC TROMETHAMINE 30 MG/ML IJ SOLN
INTRAMUSCULAR | Status: AC
  Filled 2020-08-23: qty 1

## 2020-08-23 MED ORDER — BUPIVACAINE HCL (PF) 0.5 % IJ SOLN
INTRAMUSCULAR | Status: DC | PRN
  Administered 2020-08-23: 7 mL

## 2020-08-23 MED ORDER — FENTANYL CITRATE (PF) 100 MCG/2ML IJ SOLN
INTRAMUSCULAR | Status: AC
  Filled 2020-08-23: qty 2

## 2020-08-23 MED ORDER — FENTANYL CITRATE (PF) 100 MCG/2ML IJ SOLN
INTRAMUSCULAR | Status: DC | PRN
  Administered 2020-08-23: 25 ug via INTRAVENOUS
  Administered 2020-08-23: 50 ug via INTRAVENOUS
  Administered 2020-08-23: 25 ug via INTRAVENOUS

## 2020-08-23 MED ORDER — ACETAMINOPHEN 325 MG PO TABS: 325.0000 mg | ORAL_TABLET | ORAL | Status: DC | PRN

## 2020-08-23 MED ORDER — FENTANYL CITRATE (PF) 100 MCG/2ML IJ SOLN: 25.0000 ug | INTRAMUSCULAR | Status: DC | PRN

## 2020-08-23 MED ORDER — LACTATED RINGERS IV SOLN: INTRAVENOUS | Status: DC

## 2020-08-23 MED ORDER — DEXAMETHASONE SODIUM PHOSPHATE 10 MG/ML IJ SOLN
INTRAMUSCULAR | Status: DC | PRN
  Administered 2020-08-23: 10 mg via INTRAVENOUS

## 2020-08-23 MED ORDER — OXYCODONE HCL 5 MG/5ML PO SOLN: 5.0000 mg | Freq: Once | ORAL | Status: AC | PRN

## 2020-08-23 MED ORDER — KETOROLAC TROMETHAMINE 30 MG/ML IJ SOLN: 30.0000 mg | Freq: Once | INTRAMUSCULAR | Status: DC | PRN

## 2020-08-23 MED ORDER — DEXAMETHASONE SODIUM PHOSPHATE 10 MG/ML IJ SOLN
INTRAMUSCULAR | Status: AC
  Filled 2020-08-23: qty 1

## 2020-08-23 MED ORDER — ONDANSETRON HCL 4 MG/2ML IJ SOLN: 4.0000 mg | Freq: Once | INTRAMUSCULAR | Status: DC | PRN

## 2020-08-23 MED ORDER — KETOROLAC TROMETHAMINE 30 MG/ML IJ SOLN
INTRAMUSCULAR | Status: DC | PRN
  Administered 2020-08-23: 30 mg via INTRAVENOUS

## 2020-08-23 MED ORDER — FERRIC SUBSULFATE SOLN
Status: DC | PRN
  Administered 2020-08-23: 1

## 2020-08-23 MED ORDER — ACETAMINOPHEN 160 MG/5ML PO SOLN: 325.0000 mg | ORAL | Status: DC | PRN

## 2020-08-23 MED ORDER — MEPERIDINE HCL 25 MG/ML IJ SOLN: 6.2500 mg | INTRAMUSCULAR | Status: DC | PRN

## 2020-08-23 MED ORDER — OXYCODONE HCL 5 MG PO TABS
ORAL_TABLET | ORAL | Status: AC
  Filled 2020-08-23: qty 1

## 2020-08-23 MED ORDER — OXYCODONE HCL 5 MG PO TABS
5.0000 mg | ORAL_TABLET | Freq: Once | ORAL | Status: AC | PRN
  Administered 2020-08-23: 5 mg via ORAL

## 2020-08-23 MED ORDER — POVIDONE-IODINE 10 % EX SWAB: 2.0000 "application " | Freq: Once | CUTANEOUS | Status: DC

## 2020-08-23 SURGICAL SUPPLY — 22 items
APL SWBSTK 6 STRL LF DISP (MISCELLANEOUS) ×1
APPLICATOR COTTON TIP 6 STRL (MISCELLANEOUS) IMPLANT
APPLICATOR COTTON TIP 6IN STRL (MISCELLANEOUS) ×3
CATH ROBINSON RED A/P 16FR (CATHETERS) ×3 IMPLANT
CNTNR URN SCR LID CUP LEK RST (MISCELLANEOUS) ×1 IMPLANT
CONT SPEC 4OZ STRL OR WHT (MISCELLANEOUS) ×3
GLOVE BIO SURGEON STRL SZ7 (GLOVE) ×7 IMPLANT
GLOVE BIOGEL PI IND STRL 7.0 (GLOVE) ×2 IMPLANT
GLOVE BIOGEL PI IND STRL 7.5 (GLOVE) IMPLANT
GLOVE BIOGEL PI INDICATOR 7.0 (GLOVE) ×6
GLOVE BIOGEL PI INDICATOR 7.5 (GLOVE) ×4
GOWN STRL REUS W/ TWL LRG LVL3 (GOWN DISPOSABLE) ×2 IMPLANT
GOWN STRL REUS W/ TWL XL LVL3 (GOWN DISPOSABLE) ×1 IMPLANT
GOWN STRL REUS W/TWL LRG LVL3 (GOWN DISPOSABLE) ×3
GOWN STRL REUS W/TWL XL LVL3 (GOWN DISPOSABLE) ×3
KIT PROCEDURE FLUENT (KITS) IMPLANT
KIT TURNOVER CYSTO (KITS) ×3 IMPLANT
PACK VAGINAL MINOR WOMEN LF (CUSTOM PROCEDURE TRAY) ×3 IMPLANT
PAD OB MATERNITY 4.3X12.25 (PERSONAL CARE ITEMS) ×3 IMPLANT
SWAB OB GYN 8IN STERILE 2PK (MISCELLANEOUS) ×2 IMPLANT
TOWEL OR 17X26 10 PK STRL BLUE (TOWEL DISPOSABLE) ×4 IMPLANT
UNDERPAD 30X36 HEAVY ABSORB (UNDERPADS AND DIAPERS) ×3 IMPLANT

## 2020-08-23 NOTE — H&P (Signed)
Preoperative History and Physical  Alicia Brandt is a 59 y.o. G2P2000 here for surgical management of post menopausal bleeding; cervical dysplasia and cervical mass.    Proposed surgery: exam under anesthesia, cervical biopsy, dilation and curettage and possible hysteroscopy  Past Medical History:  Diagnosis Date  . Abnormal uterine bleeding (AUB)   . Cervical dysplasia   . Cervical mass   . Hyperthyroidism summer 2021   has not followed up yet   Past Surgical History:  Procedure Laterality Date  . boil removed  yrs ago   from vagina area  . TUBAL LIGATION  30 yrs ago   OB History    Gravida  2   Para  2   Term  2   Preterm      AB      Living        SAB      TAB      Ectopic      Multiple      Live Births             Patient denies any cervical dysplasia or STIs. No medications prior to admission.    No Known Allergies Social History:   reports that she has been smoking cigarettes. She has a 10.00 pack-year smoking history. She has never used smokeless tobacco. She reports current alcohol use. She reports that she does not use drugs. Family History  Problem Relation Age of Onset  . Cancer Mother   . Diabetes Brother     Review of Systems: Noncontributory  PHYSICAL EXAM: Height 5\' 4"  (1.626 m), weight 75.8 kg, last menstrual period 05/05/2014. General appearance - alert, well appearing, and in no distress Chest - clear to auscultation, no wheezes, rales or rhonchi, symmetric air entry Heart - normal rate and regular rhythm Abdomen - soft, nontender, nondistended, no masses or organomegaly Pelvic - exam not done by me. Will perform exam under anesthesia.  Extremities - peripheral pulses normal, no pedal edema, no clubbing or cyanosis  Labs: Results for orders placed or performed during the hospital encounter of 08/22/20 (from the past 336 hour(s))  SARS CORONAVIRUS 2 (TAT 6-24 HRS) Nasopharyngeal Nasopharyngeal Swab   Collection Time: 08/22/20   3:11 PM   Specimen: Nasopharyngeal Swab  Result Value Ref Range   SARS Coronavirus 2 NEGATIVE NEGATIVE   08/03/2020 CERVIX, BIOPSY:  - High grade squamous intraepithelial lesion, CIN II-III (moderate to  severe dysplasia/CIS).   B. ENDOCERVIX, CURETTAGE:  - High grade squamous intraepithelial lesion, CIN II-III (moderate to  severe dysplasia/CIS).  - No distinct endocervical glandular component present for evaluation.   C. ENDOMETRIUM, BIOPSY:  - High grade squamous intraepithelial lesion, CIN II-III (moderate to  severe dysplasia/CIS).  - No distinct endometrial glandular component present for evaluation.   COMMENT:   A-C. Invasion is not identified in these biopsies.   Please correlate clinically.  Imaging Studies: 07/04/2020 CLINICAL DATA:  Left lower quadrant abdominal pain with bloody stool. Clinical concern for diverticulitis and nephrolithiasis.  EXAM: CT ABDOMEN AND PELVIS WITH CONTRAST  TECHNIQUE: Multidetector CT imaging of the abdomen and pelvis was performed using the standard protocol following bolus administration of intravenous contrast.  CONTRAST:  154mL OMNIPAQUE IOHEXOL 300 MG/ML  SOLN  COMPARISON:  Limited correlation made with chest CTA 04/05/2019  FINDINGS: Lower chest: Clear lung bases. No significant pleural or pericardial effusion.  Hepatobiliary: Tiny low-density lesions within the right hepatic lobe on images 14 and 31 of series 3 are likely cysts.  No suspicious hepatic findings. No evidence of gallstones, gallbladder wall thickening or biliary dilatation.  Pancreas: Unremarkable. No pancreatic ductal dilatation or surrounding inflammatory changes.  Spleen: Normal in size without focal abnormality.  Adrenals/Urinary Tract: Both adrenal glands appear normal. No evidence of urinary tract calculus, hydronephrosis or perinephric soft tissue stranding. There are subcentimeter low-density renal lesions bilaterally, likely cysts.  The bladder appears unremarkable.  Stomach/Bowel: No evidence of bowel wall thickening, distention or surrounding inflammatory change. The appendix appears normal. Mild sigmoid diverticulosis without evidence of acute inflammation.  Vascular/Lymphatic: There are no enlarged abdominal or pelvic lymph nodes. Aortic and branch vessel atherosclerosis. No acute vascular findings. The portal, superior mesenteric and splenic veins are patent.  Reproductive: No adnexal mass. The endometrial canal is distended to 13 mm. Asymmetric soft tissue fullness posteriorly in the uterine body may reflect a fibroid. There is nonspecific soft tissue fullness of the cervix. No surrounding inflammatory changes.  Other: No evidence of abdominal wall mass or hernia. No ascites.  Musculoskeletal: No acute or significant osseous findings.  IMPRESSION: 1. No acute findings or clear explanation for the patient's symptoms. No evidence of urinary tract calculus or hydronephrosis. 2. Mild sigmoid diverticulosis without evidence of acute inflammation. 3. Nonspecific soft tissue fullness of the cervix with distension endometrial canal, potentially reflecting a cervical or endometrial mass in this presumably postmenopausal patient. Correlation with GYN examination recommended. Pelvic ultrasound may be helpful for further evaluation. 4. Aortic Atherosclerosis (ICD10-I70.0).  Assessment: Patient Active Problem List   Diagnosis Date Noted  . Cervical dysplasia 08/23/2020  . Post-menopausal bleeding 08/23/2020  . Cervical mass 08/23/2020  . ARTHRALGIA 01/12/2008  . 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 OTHER ABNORMAL GLUCOSE 09/29/2007  . GERD 08/05/2007  . ABSCESS, VULVA NEC 08/05/2007  . ARTHROPATHY NOS, MULTIPLE SITES 08/05/2007    Plan: Patient will undergo surgical management with exam under anesthesia, cervical biopsy, dilation and curettage and possible hysteroscopy.   The  risks of surgery were discussed in detail with the patient including but not limited to: bleeding which may require transfusion or reoperation; infection which may require antibiotics; injury to surrounding organs which may involve bowel, bladder, ureters ; need for additional procedures including laparoscopy or laparotomy; thromboembolic phenomenon, surgical site problems and other postoperative/anesthesia complications. Likelihood of success in alleviating the patient's condition was discussed. Routine postoperative instructions will be reviewed with the patient and her family in detail after surgery.  The patient concurred with the proposed plan, giving informed written consent for the surgery.  Patient has been NPO since last night she will remain NPO for procedure.  Anesthesia and OR aware.  Preoperative prophylactic antibiotics and SCDs ordered on call to the OR.  To OR when ready.  Emilyrose Darrah L. Harraway-Smith, M.D., [facility] 08/23/2020 10:08 AM

## 2020-08-23 NOTE — Op Note (Signed)
08/23/2020  2:59 PM  PATIENT:  Alicia Brandt  59 y.o. female  PRE-OPERATIVE DIAGNOSIS:  Cervical Mass AUB Cervical Dysplasia  POST-OPERATIVE DIAGNOSIS:  Cervical Mass AUB Cervical Dysplasia  PROCEDURE:  Procedure(s): EXAM UNDER ANESTHESIA, ENDOCERVICAL  CURETTAGE AND CERVICAL BIOPSY (N/A) and endocervical curettage .   SURGEON:  Surgeon(s) and Role:    * Lavonia Drafts, MD - Primary  ANESTHESIA:   general and paracervical block  EBL:  Minimal    BLOOD ADMINISTERED:none  DRAINS: none   LOCAL MEDICATIONS USED:  MARCAINE     SPECIMEN:  Source of Specimen:  ecto and endo cervical biopsy .    DISPOSITION OF SPECIMEN:  PATHOLOGY  COUNTS:  YES  TOURNIQUET:  * No tourniquets in log *  DICTATION: .Note written in [place]: Discharge to home after PACU  PATIENT DISPOSITION:  PACU - hemodynamically stable.   Delay start of Pharmacological VTE agent (>24hrs) due to surgical blood loss or risk of bleeding: not applicable  Complications: none immediate.   INDICATIONS: 59 y.o. here for scheduled surgery for post menopausal bleeding and mass on cervix. Risks of surgery were discussed with the patient including but not limited to: bleeding which may require transfusion; infection which may require antibiotics; injury to uterus or surrounding organs; intrauterine scarring which may impair future fertility; need for additional procedures including laparotomy or laparoscopy; and other postoperative/anesthesia complications. Written informed consent was obtained.    FINDINGS:  A 9 week size uterus. cervix with friable lesion on left side at 3 o'clock. There are small white lesions protruding from the cervix in multiple areas. The endocervix is extremely friable. This is concerning for cervical cancer.   PROCEDURE DETAILS:  The patient was taken to the operating room where general anesthesia was administered with LMA and was found to be adequate.  After an adequate  timeout was performed, she was placed in the dorsal lithotomy position and examined; then prepped and draped in the sterile manner.   Her bladder was catheterized for an unmeasured amount of clear, yellow urine. A speculum was then placed in the patient's vagina and a single toothed tenaculum was placed on the anterior lip of the cervix. A paracervical nerve block was performed using 20cc of  Marcaine 0.5%. The lesions on the ectocervix were biopsied using the cervical biopsy forceps.  A curette was used to perform endocervical curettage. The tenaculum was removed from the anterior lip of the cervix and the vaginal speculum was removed after noting good hemostasis.  The patient tolerated the procedure well and was taken to the recovery area awake, extubated and in stable condition. All specimen were sent to pathology.   The patient will be discharged to home as per PACU criteria.  Routine postoperative instructions given.  She will follow up in the clinic in 1 weeks for postoperative evaluation and review of pathology.   Shenoa Hattabaugh L. Harraway-Smith, M.D., [facility]

## 2020-08-23 NOTE — Discharge Instructions (Signed)
  Post Anesthesia Home Care Instructions  Activity: Get plenty of rest for the remainder of the day. A responsible individual must stay with you for 24 hours following the procedure.  For the next 24 hours, DO NOT: -Drive a car -Operate machinery -Drink alcoholic beverages -Take any medication unless instructed by your physician -Make any legal decisions or sign important papers.  Meals: Start with liquid foods such as gelatin or soup. Progress to regular foods as tolerated. Avoid greasy, spicy, heavy foods. If nausea and/or vomiting occur, drink only clear liquids until the nausea and/or vomiting subsides. Call your physician if vomiting continues.  Special Instructions/Symptoms: Your throat may feel dry or sore from the anesthesia or the breathing tube placed in your throat during surgery. If this causes discomfort, gargle with warm salt water. The discomfort should disappear within 24 hours.  D & [facility] Home care Instructions:   Personal hygiene:  Used sanitary napkins for vaginal drainage not tampons. Shower or tub bathe the day after your procedure. No douching until bleeding stops. Always wipe from front to back after  Elimination.  Activity: Do not drive or operate any equipment today. The effects of the anesthesia are still present and drowsiness may result. Rest today, not necessarily flat bed rest, just take it easy. You may resume your normal activity in one to 2 days.  Sexual activity: No intercourse for one week or as indicated by your physician  Diet: Eat a light diet as desired this evening. You may resume a regular diet tomorrow.  Return to work: One to 2 days.  General Expectations of your surgery: Vaginal bleeding should be no heavier than a normal period. Spotting may continue up to 10 days. Mild cramps may continue for a couple of days. You may have a regular period in 2-6 weeks.  Unexpected observations call your doctor if these occur: persistent or heavy bleeding.  Severe abdominal cramping or pain. Elevation of temperature greater than 100F.  Call for an appointment in one week.    

## 2020-08-23 NOTE — Brief Op Note (Signed)
08/23/2020  2:59 PM  PATIENT:  Alicia Brandt  58 y.o. female  PRE-OPERATIVE DIAGNOSIS:  Cervical Mass AUB Cervical Dysplasia  POST-OPERATIVE DIAGNOSIS:  Cervical Mass AUB Cervical Dysplasia  PROCEDURE:  Procedure(s): EXAM UNDER ANESTHESIA, ENDOCERVICAL  CURETTAGE AND CERVICAL BIOPSY (N/A)  SURGEON:  Surgeon(s) and Role:    * Lavonia Drafts, MD - Primary  ANESTHESIA:   general and paracervical block  EBL:  Minimal    BLOOD ADMINISTERED:none  DRAINS: none   LOCAL MEDICATIONS USED:  MARCAINE     SPECIMEN:  Source of Specimen:  ecto and endo cervical biopsy .    DISPOSITION OF SPECIMEN:  PATHOLOGY  COUNTS:  YES  TOURNIQUET:  * No tourniquets in log *  DICTATION: .Note written in [place]: Discharge to home after PACU  PATIENT DISPOSITION:  PACU - hemodynamically stable.   Delay start of Pharmacological VTE agent (>24hrs) due to surgical blood loss or risk of bleeding: not applicable  Complications: none immediate.   Amandeep Hogston L. Harraway-Smith, M.D., [facility]

## 2020-08-23 NOTE — Transfer of Care (Signed)
Immediate Anesthesia Transfer of Care Note  Patient: Alicia Brandt  Procedure(s) Performed: EXAM UNDER ANESTHESIA, ENDOCERVICAL  CURETTAGE AND CERVICAL BIOPSY (N/A Vagina )  Patient Location: PACU  Anesthesia Type:General  Level of Consciousness: awake, alert  and oriented  Airway & Oxygen Therapy: Patient Spontanous Breathing and Patient connected to nasal cannula oxygen  Post-op Assessment: Report given to RN and Post -op Vital signs reviewed and stable  Post vital signs: Reviewed and stable  Last Vitals:  Vitals Value Taken Time  BP    Temp    Pulse 60 08/23/20 1456  Resp 12 08/23/20 1456  SpO2 100 % 08/23/20 1456  Vitals shown include unvalidated device data.  Last Pain:  Vitals:   08/23/20 1322  TempSrc: Oral  PainSc: 0-No pain      Patients Stated Pain Goal: 3 (61/68/37 2902)  Complications: No complications documented.

## 2020-08-23 NOTE — Anesthesia Procedure Notes (Signed)
Procedure Name: LMA Insertion Date/Time: 08/23/2020 2:26 PM Performed by: Demontez Novack D, CRNA Pre-anesthesia Checklist: Patient identified, Emergency Drugs available, Suction available and Patient being monitored Patient Re-evaluated:Patient Re-evaluated prior to induction Oxygen Delivery Method: Circle system utilized Preoxygenation: Pre-oxygenation with 100% oxygen Induction Type: IV induction Ventilation: Mask ventilation without difficulty LMA: LMA inserted LMA Size: 4.0 Tube type: Oral Number of attempts: 1 Placement Confirmation: positive ETCO2 and breath sounds checked- equal and bilateral Tube secured with: Tape Dental Injury: Teeth and Oropharynx as per pre-operative assessment

## 2020-08-23 NOTE — Anesthesia Preprocedure Evaluation (Signed)
Anesthesia Evaluation  Patient identified by MRN, date of birth, ID band Patient awake    Reviewed: Allergy & Precautions, NPO status , Patient's Chart, lab work & pertinent test results  Airway Mallampati: I  TM Distance: >3 FB Neck ROM: Full    Dental no notable dental hx.    Pulmonary Current Smoker and Patient abstained from smoking.,    Pulmonary exam normal        Cardiovascular Normal cardiovascular exam     Neuro/Psych negative neurological ROS  negative psych ROS   GI/Hepatic Neg liver ROS,   Endo/Other    Renal/GU negative Renal ROS  negative genitourinary   Musculoskeletal negative musculoskeletal ROS (+)   Abdominal Normal abdominal exam  (+)   Peds  Hematology negative hematology ROS (+)   Anesthesia Other Findings   Reproductive/Obstetrics                             Anesthesia Physical Anesthesia Plan  ASA: II  Anesthesia Plan: General   Post-op Pain Management:    Induction: Intravenous  PONV Risk Score and Plan: 4 or greater and Ondansetron, Dexamethasone and Midazolam  Airway Management Planned: LMA  Additional Equipment: None  Intra-op Plan:   Post-operative Plan: Extubation in OR  Informed Consent: I have reviewed the patients History and Physical, chart, labs and discussed the procedure including the risks, benefits and alternatives for the proposed anesthesia with the patient or authorized representative who has indicated his/her understanding and acceptance.     Dental advisory given  Plan Discussed with: CRNA  Anesthesia Plan Comments:         Anesthesia Quick Evaluation

## 2020-08-24 ENCOUNTER — Encounter (HOSPITAL_BASED_OUTPATIENT_CLINIC_OR_DEPARTMENT_OTHER): Payer: Self-pay | Admitting: Obstetrics & Gynecology

## 2020-08-24 NOTE — Anesthesia Postprocedure Evaluation (Signed)
Anesthesia Post Note  Patient: Alicia Brandt  Procedure(s) Performed: EXAM UNDER ANESTHESIA, ENDOCERVICAL  CURETTAGE AND CERVICAL BIOPSY (N/A Vagina )     Patient location during evaluation: PACU Anesthesia Type: General Level of consciousness: awake and sedated Pain management: satisfactory to patient Vital Signs Assessment: post-procedure vital signs reviewed and stable Respiratory status: spontaneous breathing Cardiovascular status: stable Postop Assessment: no apparent nausea or vomiting Anesthetic complications: no   No complications documented.  Last Vitals:  Vitals:   08/23/20 1530 08/23/20 1615  BP: 121/63 (!) 127/54  Pulse: 71 (!) 59  Resp: 19 14  Temp:  36.4 C  SpO2: 100% 99%    Last Pain:  Vitals:   08/23/20 1604  TempSrc:   PainSc: [place] Jr

## 2020-08-25 LAB — SURGICAL PATHOLOGY

## 2020-08-29 ENCOUNTER — Encounter: Payer: Self-pay | Admitting: Obstetrics & Gynecology

## 2020-08-29 ENCOUNTER — Telehealth: Payer: Self-pay | Admitting: *Deleted

## 2020-08-29 ENCOUNTER — Ambulatory Visit (INDEPENDENT_AMBULATORY_CARE_PROVIDER_SITE_OTHER): Payer: BLUE CROSS/BLUE SHIELD | Admitting: Obstetrics & Gynecology

## 2020-08-29 ENCOUNTER — Other Ambulatory Visit: Payer: Self-pay

## 2020-08-29 VITALS — BP 138/63 | HR 83 | Ht 64.0 in | Wt 168.0 lb

## 2020-08-29 DIAGNOSIS — C530 Malignant neoplasm of endocervix: Secondary | ICD-10-CM

## 2020-08-29 NOTE — Progress Notes (Signed)
History:  59 y.o. G2P2000 here today for review of surgical pathology reports.  Pt reports minimal bleeding since her procedure. She has good pain control. She is here with her daughter and husband.   The following portions of the patient's history were reviewed and updated as appropriate: allergies, current medications, past family history, past medical history, past social history, past surgical history and problem list.  Review of Systems:  Pertinent items are noted in HPI.    Objective:  Physical Exam Blood pressure 138/63, pulse 83, height 5\' 4"  (1.626 m), weight 168 lb (76.2 kg), last menstrual period 05/05/2014.  CONSTITUTIONAL: Well-developed, well-nourished female in no acute distress.  HENT:  Normocephalic, atraumatic EYES: Conjunctivae and EOM are normal. No scleral icterus.  NECK: Normal range of motion SKIN: Skin is warm and dry. No rash noted. Not diaphoretic.No pallor. [place]: Alert and oriented to person, place, and time. Normal coordination.  Pelvic: not done.   Labs  and Imaging 08/23/2020 SURGICAL PATHOLOGY  THIS IS AN ADDENDUM REPORT  CASE: [id number]  PATIENT: Alicia Brandt  Surgical Pathology Report  Addendum    Reason for Addendum #1: Immunohistochemistry results   Clinical History: Cervical mass, AUB, cervical dysplasia (crm)   FINAL MICROSCOPIC DIAGNOSIS:   A. 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 See comment   Assessment & Plan:  I reviewed the surg path and the dx of cervical cancer with the pt and her family.  I have already made them an appt with Dr. Denman George and pt is aware that she has a choice of providers but, her and her family want to stay in the [facility] system and be seen by Dr. Denman George.  All of their questions were answered.  Alicia Brandt L. Harraway-Smith, M.D., [facility]

## 2020-08-29 NOTE — Telephone Encounter (Signed)
Called and moved the patient's appt from 12/6 to 11/10

## 2020-08-31 ENCOUNTER — Encounter: Payer: Self-pay | Admitting: Gynecologic Oncology

## 2020-08-31 ENCOUNTER — Encounter: Payer: Self-pay | Admitting: Oncology

## 2020-08-31 ENCOUNTER — Encounter: Payer: Self-pay | Admitting: Hematology and Oncology

## 2020-08-31 ENCOUNTER — Other Ambulatory Visit: Payer: Self-pay

## 2020-08-31 ENCOUNTER — Encounter: Payer: BLUE CROSS/BLUE SHIELD | Admitting: Obstetrics & Gynecology

## 2020-08-31 ENCOUNTER — Inpatient Hospital Stay: Payer: BLUE CROSS/BLUE SHIELD | Attending: Gynecologic Oncology | Admitting: Gynecologic Oncology

## 2020-08-31 VITALS — BP 114/56 | HR 73 | Temp 96.9°F | Resp 18 | Ht 64.0 in | Wt 164.8 lb

## 2020-08-31 DIAGNOSIS — F17210 Nicotine dependence, cigarettes, uncomplicated: Secondary | ICD-10-CM | POA: Diagnosis not present

## 2020-08-31 DIAGNOSIS — E0590 Thyrotoxicosis, unspecified without thyrotoxic crisis or storm: Secondary | ICD-10-CM | POA: Insufficient documentation

## 2020-08-31 DIAGNOSIS — Z6828 Body mass index (BMI) 28.0-28.9, adult: Secondary | ICD-10-CM | POA: Diagnosis not present

## 2020-08-31 DIAGNOSIS — C539 Malignant neoplasm of cervix uteri, unspecified: Secondary | ICD-10-CM | POA: Insufficient documentation

## 2020-08-31 DIAGNOSIS — E663 Overweight: Secondary | ICD-10-CM | POA: Insufficient documentation

## 2020-08-31 DIAGNOSIS — C531 Malignant neoplasm of exocervix: Secondary | ICD-10-CM | POA: Insufficient documentation

## 2020-08-31 NOTE — Patient Instructions (Signed)
Your diagnosis is either stage 1C or 2 B cervical cancer. The recommended treatement for this is radiation and chemotherapy. If the MRI shows that this is a stage IC (not 2B) cancer, then hysterectomy may be an option for you 6 weeks after completing radiation.  Dr Serita Grit office will order the MRI and PET scan to evaluate for metastatic disease. Her office will schedule appointments with the radiation and chemotherapy doctors to discuss starting treatment.

## 2020-08-31 NOTE — Progress Notes (Signed)
Met with Alicia Brandt and her daughter after Dr. Serita Grit appointment.  Discussed recommended treatment plan of concurrent chemotherapy and radiation and went over upcoming appointments.  Also will refer her to the financial advocates for assistance.  Encouraged them to call with any questions or needs.

## 2020-08-31 NOTE — Progress Notes (Signed)
Consult Note: Gyn-Onc  Consult was requested by Dr. Ihor Dow for the evaluation of Alicia Brandt 59 y.o. female  CC:  Chief Complaint  Patient presents with  . Cervical Cancer    Assessment/Plan:  Ms. Alicia Brandt  is a 59 y.o.  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  I spent time explaining the underlying etiology of the disease and its staging with the patient and her daughter.  I explained the recommendation for locally advanced cervical cancer is for definitive chemoradiation.  I explained there may be a role for interval extrafascial hysterectomy if she has had good response to chemoradiation.  We will obtain a PET scan to evaluate for distant metastatic disease.  If this is negative, we can be reassured regarding curative intent definitive chemoradiation.  We will first obtain MRI imaging to assist in clinical staging (because if there is no clinical parametrial extension, she may be a candidate for an interval extrafascial hysterectomy after radiation is complete, which, while not associated with improved survival, is associated with decreased central recurrence and may minimize toxicities from radiation).   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  The patient reported having an abnormal Pap smear in the 1990s.  She has had a preceding history of multiple abnormal Pap smears prior to that.  At that time her gynecologist, Dr. Eulas Post, recommended an excisional procedure however the patient admitted that she neglected to return for this procedure or additional Pap testing thereafter.  She reported having symptoms of back pain for approximately 2 years.  These reported as right flank pain.  In the early summer 2021 she began experiencing postmenopausal bleeding.  She presented to the  emergency department of [facility] on July 04, 2020 as she did not have an established primary care doctor or gynecologist.  Her primary complaint at that time was back pain.  A CT scan of the abdomen and pelvis was performed on 07/04/2020 and showed no acute findings or clear explanations for her back pain.  There was no hydronephrosis.  There was nonspecific soft tissue fullness of the cervix with distention of the endometrial canal potentially reflecting a cervical or endometrial mass.  As she also endorsed symptoms of vaginal bleeding at that ER visit, pelvic examination was performed, an abnormality in the cervix was visualized by the emergency room physician who commented regarding his suspicion of possible underlying malignancy.  She was provided with recommendations and referral information for an evaluation with OB/GYN.  The first available appointment that she was provided with at that time was on August 03, 2020.  At that time she saw Dr. Nehemiah Settle who performed an examination that confirmed cervical lesions at 12:00 and 5:00 both of which were biopsied.  These showed CIN-2 to 3.  Given the underlying concern for an occult malignancy that was not captured on these biopsies including the endocervical curette, she was then taken to the operating room with Dr. Ihor Dow on 08/23/2020 for an examination under anesthesia, cervical biopsy and endocervical curette.  At that time intraoperative findings were significant for a cervix with a friable lesion on the left side at 3:00.  There were small white lesions protruding from the cervix in multiple areas.  The endocervix was extremely friable.  Biopsies from that procedure on 08/23/2020 revealed well  differentiated squamous cell carcinoma arising in a background of CIN-3.  Her medical history is most significant for a lack of medical care for several decades due to lack of health insurance.  She denied history of hospitalization  the.  Her surgical history is most significant for a tubal ligation.  Her gynecologic history is remarkable for a longstanding history of abnormal Pap smears with her last gynecologic care in the 1990s.  She has a history of 2 prior vaginal deliveries.  She has a longstanding history of tobacco use.  She works at a [employer], [employer] She lives with her husband who is filing for disability due to heart disease.   Current Meds:  Outpatient Encounter Medications as of 08/31/2020  Medication Sig  . acetaminophen (TYLENOL) 500 MG tablet Take 1,000 mg by mouth every 6 (six) hours as needed.  Marland Kitchen ibuprofen (ADVIL) 200 MG tablet Take 200 mg by mouth every 6 (six) hours as needed. Takes 5 of 200 mg tabs  . naproxen sodium (ALEVE) 220 MG tablet Take 220 mg by mouth 2 (two) times daily as needed.   No facility-administered encounter medications on file as of 08/31/2020.    Allergy: No Known Allergies  Social Hx:   Social History   Socioeconomic History  . Marital status: Legally Separated    Spouse name: Not on file  . Number of children: Not on file  . Years of education: Not on file  . Highest education level: Not on file  Occupational History  . Not on file  Tobacco Use  . Smoking status: Current Every Day Smoker    Packs/day: 0.25    Years: 42.00    Pack years: 10.50    Types: Cigarettes  . Smokeless tobacco: Never Used  Vaping Use  . Vaping Use: Never used  Substance and Sexual Activity  . Alcohol use: Yes    Comment: qod 2 cans cooler  . Drug use: No  . Sexual activity: Not on file  Other Topics Concern  . Not on file  Social History Narrative  . Not on file   Social Determinants of Health   Financial Resource Strain:   . Difficulty of Paying Living Expenses: Not on file  Food Insecurity:   . Worried About Charity fundraiser in the Last Year: Not on file  . Ran Out of Food in the Last Year: Not on file  Transportation Needs:   . Lack of Transportation  (Medical): Not on file  . Lack of Transportation (Non-Medical): Not on file  Physical Activity:   . Days of Exercise per Week: Not on file  . Minutes of Exercise per Session: Not on file  Stress:   . Feeling of Stress : Not on file  Social Connections:   . Frequency of Communication with Friends and Family: Not on file  . Frequency of Social Gatherings with Friends and Family: Not on file  . Attends Religious Services: Not on file  . Active Member of Clubs or Organizations: Not on file  . Attends Archivist Meetings: Not on file  . Marital Status: Not on file  Intimate Partner Violence:   . Fear of Current or Ex-Partner: Not on file  . Emotionally Abused: Not on file  . Physically Abused: Not on file  . Sexually Abused: Not on file    Past Surgical Hx:  Past Surgical History:  Procedure Laterality Date  . boil removed  yrs ago   from vagina area  .  DILATION AND CURETTAGE OF UTERUS N/A 08/23/2020   Procedure: EXAM UNDER ANESTHESIA, ENDOCERVICAL  CURETTAGE AND CERVICAL BIOPSY;  Surgeon: Lavonia Drafts, MD;  Location: [facility];  Service: Gynecology;  Laterality: N/A;  . TUBAL LIGATION  30 yrs ago    Past Medical Hx:  Past Medical History:  Diagnosis Date  . Abnormal uterine bleeding (AUB)   . Cervical dysplasia   . Cervical mass   . Hyperthyroidism summer 2021   has not followed up yet    Past Gynecological History:  See HPI, + for cervical dysplasia, lack of gynecologic follow-up care in the 90's, 2 prior SVDs. Tubal ligation. Patient's last menstrual period was 05/05/2014 (approximate).  Family Hx:  Family History  Problem Relation Age of Onset  . Cancer Mother 43       breast  . Diabetes Brother     Review of Systems:  Constitutional  Feels well,    ENT Normal appearing ears and nares bilaterally Skin/Breast  No rash, sores, jaundice, itching, dryness Cardiovascular  No chest pain, shortness of breath, or edema   Pulmonary  No cough or wheeze.  Gastro Intestinal  No nausea, vomitting, or diarrhoea. No bright red blood per rectum, no abdominal pain, change in bowel movement, or constipation.  Genito Urinary  No frequency, urgency, dysuria, + postmenopausal bleeding Musculo Skeletal  No myalgia, arthralgia, joint swelling or pain  Neurologic  No weakness, numbness, change in gait,  Psychology  No depression, anxiety, insomnia.   Vitals:  Blood pressure (!) 114/56, pulse 73, temperature (!) 96.9 F (36.1 C), temperature source Tympanic, resp. rate 18, height 5\' 4"  (1.626 m), weight 164 lb 12.8 oz (74.8 kg), last menstrual period 05/05/2014, SpO2 99 %.  Physical Exam: WD in NAD Neck  Supple NROM, without any enlargements.  Lymph Node Survey No cervical supraclavicular or inguinal adenopathy Cardiovascular  Well perfused peripheries Lungs  No increased WOB Skin  No rash/lesions/breakdown  Psychiatry  Alert and oriented to person, place, and time  Abdomen  Normoactive bowel sounds, abdomen soft, non-tender and mildly overweight (BMI 28) without evidence of hernia. Back No CVA tenderness Genito Urinary  Vulva/vagina: Normal external female genitalia.   No lesions. No discharge or bleeding.  Bladder/urethra:  No lesions or masses, well supported bladder  Vagina: normal, no lesions  Cervix: 5cm, hard mass replacing cervix with os still visible, entire cervix replaced by palpable and visible tumor. Friable cervix but no active hemorrhage.   Uterus: Small, mobile, + subtle parametrial involvement on the left.  Adnexa: no discretely palpable masses. Rectal  Lt parametrial extension (possible) Extremities  No bilateral cyanosis, clubbing or edema.  60 minutes of total time was spent for this patient encounter, including preparation, face-to-face counseling with the patient and coordination of care, review of imaging (results and images), communication with the referring provider and  documentation of the encounter.   Thereasa Solo, MD  08/31/2020, 5:17 PM

## 2020-09-05 ENCOUNTER — Telehealth: Payer: Self-pay | Admitting: *Deleted

## 2020-09-05 ENCOUNTER — Telehealth: Payer: Self-pay

## 2020-09-05 NOTE — Telephone Encounter (Signed)
Called and left the patient a message to call the office back. Patient's MRI scan will be OON and have a higher cost. Need to see if she wants to keep appt.

## 2020-09-05 NOTE — Telephone Encounter (Signed)
Melissa called and stated that the wrong NPI and Tax ID was used to prior auth MRI for 09-07-20. LM for Melissa that the MRI was prior auth. By Marcie Bal. Will send [place] to see if she needs to follow up with Melissa in pre service center.  Melissa stated that the correct NPI number is [id number] and the correct Tax ID number is [id number].

## 2020-09-06 ENCOUNTER — Telehealth: Payer: Self-pay | Admitting: Oncology

## 2020-09-06 ENCOUNTER — Encounter: Payer: Self-pay | Admitting: Obstetrics & Gynecology

## 2020-09-06 NOTE — Telephone Encounter (Signed)
Called Alicia Brandt back and asked if they would be able to go to the cancer center at [facility] for treatment due to [place] insurance.  She said that will be a 45 minute drive for them which is too far to take her everyday for treatment.  Alicia Brandt is a 12 minute drive.  She would still like to keep her appointments here if possible.  She said she did call customer service at [facility] and the financial assistance application is in process.  They will call her back in 2 days with the status of the application.  Also gave her Annamary Rummage, Financial Advocate number to see if there are any other options.

## 2020-09-06 NOTE — Telephone Encounter (Signed)
[facility] and scheduled PET scan for 09/12/20 at 9:30 and MRI for 10/05/20 at 5 pm (one of their MRI scanners is down).   Called Dashia and notified her of appointments and location.  She verbalized understanding and agreement.

## 2020-09-06 NOTE — Telephone Encounter (Signed)
Called Laurene Footman (daughter) and advised that the MRI will be out of network with her Mom's insurance.  Advised her that the copay would be $4264.  She would like to schedule the scan with [facility] at [facility] if possible.  Also discussed that they would like to keep her treatments here because she does not have transportation to go to [place] everyday.    Laurene Footman also said they have applied for the [place] assistance and have not heard anything yet.  Advised her to call customer service at [phone] to check on the status.  Also discussed calling Mckaila's insurance to see if anything can be done so that she would be covered with [facility] due to lack of transportation to [place].

## 2020-09-07 ENCOUNTER — Ambulatory Visit (HOSPITAL_COMMUNITY): Payer: BLUE CROSS/BLUE SHIELD

## 2020-09-07 ENCOUNTER — Inpatient Hospital Stay: Payer: BLUE CROSS/BLUE SHIELD

## 2020-09-07 ENCOUNTER — Telehealth: Payer: Self-pay | Admitting: Oncology

## 2020-09-07 ENCOUNTER — Inpatient Hospital Stay (HOSPITAL_BASED_OUTPATIENT_CLINIC_OR_DEPARTMENT_OTHER): Payer: BLUE CROSS/BLUE SHIELD | Admitting: Hematology and Oncology

## 2020-09-07 ENCOUNTER — Encounter: Payer: Self-pay | Admitting: Hematology and Oncology

## 2020-09-07 ENCOUNTER — Other Ambulatory Visit: Payer: Self-pay

## 2020-09-07 DIAGNOSIS — C530 Malignant neoplasm of endocervix: Secondary | ICD-10-CM | POA: Diagnosis not present

## 2020-09-07 DIAGNOSIS — C531 Malignant neoplasm of exocervix: Secondary | ICD-10-CM | POA: Diagnosis not present

## 2020-09-07 NOTE — Progress Notes (Signed)
Alicia Brandt CONSULT NOTE  Patient Care Team: Patient, No Pcp Per as PCP - General (General Practice)  ASSESSMENT & PLAN:  Cervical cancer ([place]) In general, the approach for locally advanced cervical cancer would be concurrent chemoradiation therapy For this, I agree with Dr. Serita Grit recommendation to order PET CT scan and MRI to assist in accurate staging and treatment planning Unfortunately, due to her insurance issue, we are not able to accommodate her treatment here without excessive costs out-of-pocket for the patient and family Per patient's family request, we have directed her imaging studies to be done at [facility] location which is within her insurance network We will also send the referral for the patient to be seen, evaluated and treated in that location I did not go into details with the plan of care and treatment options with the patient as I will not be her treating physician I discussed the plan with Dr. Denman George and she is in agreement for the patient to be referred to that location I will get my GYN oncology navigator to assist in arranging appointment for her to be seen and treated there   No orders of the defined types were placed in this encounter.   The total time spent in the appointment was 30 minutes encounter with patients including review of chart and various tests results, discussions about plan of care and coordination of care plan   All questions were answered. The patient knows to call the clinic with any problems, questions or concerns. No barriers to learning was detected.  Heath Lark, MD 11/17/20212:34 PM  CHIEF COMPLAINTS/PURPOSE OF CONSULTATION:  Cervical cancer, for further management  HISTORY OF PRESENTING ILLNESS:  Alicia Brandt 59 y.o. female is here because of recent diagnosis of cervical cancer She is here accompanied by her daughter The patient lives with her husband but depends on family members for  transportation She was found to have cervical cancer after presentation with postmenopausal bleeding She have history of abnormal Pap smear She has been in and out to emergency department because of nonspecific abdominal pain/flank pain Her imaging study from September show abnormalities in her pelvic region and she was subsequently referred to GYN and GYN surgeon for evaluation  I have reviewed her chart and materials related to her cancer extensively and collaborated history with the patient. Summary of oncologic history is as follows: Oncology History  Cervical cancer ([place])  06/14/2020 Initial Diagnosis   The patient reported having an abnormal Pap smear in the 1990s.  She has had a preceding history of multiple abnormal Pap smears prior to that.  At that time her gynecologist, Dr. Eulas Post, recommended an excisional procedure however the patient admitted that she neglected to return for this procedure or additional Pap testing thereafter.   She reported having symptoms of back pain for approximately 2 years.  These reported as right flank pain.  In the early summer 2021 she began experiencing postmenopausal bleeding.  She initially went to urgent care on August 24 with symptoms of renal colic and was given Toradol and discharged with PCP follow-up.  Subsequently, she presented to the emergency department of [facility] on July 04, 2020 as she did not have an established primary care doctor or gynecologist.  Her primary complaint at that time was back pain.   07/04/2020 Imaging   1. No acute findings or clear explanation for the patient's symptoms. No evidence of urinary tract calculus or hydronephrosis. 2. Mild sigmoid diverticulosis without  evidence of acute inflammation. 3. Nonspecific soft tissue fullness of the cervix with distension endometrial canal, potentially reflecting a cervical or endometrial mass in this presumably postmenopausal patient. Correlation with GYN examination  recommended. Pelvic ultrasound may be helpful for further evaluation. 4. Aortic Atherosclerosis (ICD10-I70.0).   08/03/2020 Pathology Results   A. CERVIX, BIOPSY:  - High grade squamous intraepithelial lesion, CIN II-III (moderate to severe dysplasia/CIS).   B. ENDOCERVIX, CURETTAGE:  - High grade squamous intraepithelial lesion, CIN II-III (moderate to severe dysplasia/CIS).  - No distinct endocervical glandular component present for evaluation.   C. ENDOMETRIUM, BIOPSY:  - High grade squamous intraepithelial lesion, CIN II-III (moderate to severe dysplasia/CIS).  - No distinct endometrial glandular component present for evaluation.   COMMENT:   A-C. Invasion is not identified in these biopsies.    08/23/2020 Pathology Results   A. ENDOCERVIX, BIOPSY:  - Focus of invasive well-differentiated squamous cell carcinoma arising in a background of extensive high-grade dysplasia (CIN3).     She denies pain at present time today. The patient and family members have disclosed difficulties of her being treated here within [facility] facility due to insurance being out of network and transportation issues  MEDICAL HISTORY:  Past Medical History:  Diagnosis Date  . Abnormal uterine bleeding (AUB)   . Cervical dysplasia   . Cervical mass   . Hyperthyroidism summer 2021   has not followed up yet    SURGICAL HISTORY: Past Surgical History:  Procedure Laterality Date  . boil removed  yrs ago   from vagina area  . DILATION AND CURETTAGE OF UTERUS N/A 08/23/2020   Procedure: EXAM UNDER ANESTHESIA, ENDOCERVICAL  CURETTAGE AND CERVICAL BIOPSY;  Surgeon: Lavonia Drafts, MD;  Location: [facility];  Service: Gynecology;  Laterality: N/A;  . TUBAL LIGATION  30 yrs ago    SOCIAL HISTORY: Social History   Socioeconomic History  . Marital status: Legally Separated    Spouse name: Not on file  . Number of children: Not on file  . Years of education: Not on file  .  Highest education level: Not on file  Occupational History  . Not on file  Tobacco Use  . Smoking status: Current Every Day Smoker    Packs/day: 0.25    Years: 42.00    Pack years: 10.50    Types: Cigarettes  . Smokeless tobacco: Never Used  Vaping Use  . Vaping Use: Never used  Substance and Sexual Activity  . Alcohol use: Yes    Comment: qod 2 cans cooler  . Drug use: No  . Sexual activity: Not on file  Other Topics Concern  . Not on file  Social History Narrative  . Not on file   Social Determinants of Health   Financial Resource Strain:   . Difficulty of Paying Living Expenses: Not on file  Food Insecurity:   . Worried About Charity fundraiser in the Last Year: Not on file  . Ran Out of Food in the Last Year: Not on file  Transportation Needs:   . Lack of Transportation (Medical): Not on file  . Lack of Transportation (Non-Medical): Not on file  Physical Activity:   . Days of Exercise per Week: Not on file  . Minutes of Exercise per Session: Not on file  Stress:   . Feeling of Stress : Not on file  Social Connections:   . Frequency of Communication with Friends and Family: Not on file  . Frequency of Social Gatherings  with Friends and Family: Not on file  . Attends Religious Services: Not on file  . Active Member of Clubs or Organizations: Not on file  . Attends Archivist Meetings: Not on file  . Marital Status: Not on file  Intimate Partner Violence:   . Fear of Current or Ex-Partner: Not on file  . Emotionally Abused: Not on file  . Physically Abused: Not on file  . Sexually Abused: Not on file    FAMILY HISTORY: Family History  Problem Relation Age of Onset  . Cancer Mother 31       breast  . Diabetes Brother     ALLERGIES:  has No Known Allergies.  MEDICATIONS:  Current Outpatient Medications  Medication Sig Dispense Refill  . acetaminophen (TYLENOL) 500 MG tablet Take 1,000 mg by mouth every 6 (six) hours as needed.    Marland Kitchen ibuprofen  (ADVIL) 200 MG tablet Take 200 mg by mouth every 6 (six) hours as needed. Takes 5 of 200 mg tabs    . naproxen sodium (ALEVE) 220 MG tablet Take 220 mg by mouth 2 (two) times daily as needed.     No current facility-administered medications for this visit.    REVIEW OF SYSTEMS:  Not obtained  PHYSICAL EXAMINATION: ECOG PERFORMANCE STATUS: 1 - Symptomatic but completely ambulatory  Vitals:   09/07/20 1408  BP: (!) 133/50  Pulse: 62  Resp: 18  Temp: 97.7 F (36.5 C)  SpO2: 100%   Filed Weights   09/07/20 1408  Weight: 171 lb 6.4 oz (77.7 kg)    GENERAL:alert, no distress and comfortable  LABORATORY DATA:  I have reviewed the data as listed Lab Results  Component Value Date   WBC 12.8 (H) 08/23/2020   HGB 12.4 08/23/2020   HCT 36.9 08/23/2020   MCV 86.0 08/23/2020   PLT 499 (H) 08/23/2020   Recent Labs    07/04/20 0725 07/04/20 1147  NA 137  --   K 3.9  --   CL 105  --   CO2 22  --   GLUCOSE 107*  --   BUN 11  --   CREATININE 0.61  --   CALCIUM 9.5  --   GFRNONAA >60  --   GFRAA >60  --   PROT  --  8.1  ALBUMIN  --  3.5  AST  --  13*  ALT  --  12  ALKPHOS  --  67  BILITOT  --  0.4  BILIDIR  --  <0.1  IBILI  --  NOT CALCULATED    RADIOGRAPHIC STUDIES: I have reviewed her recent CT imaging I have personally reviewed the radiological images as listed and agreed with the findings in the report.

## 2020-09-07 NOTE — Assessment & Plan Note (Signed)
In general, the approach for locally advanced cervical cancer would be concurrent chemoradiation therapy For this, I agree with Dr. Serita Grit recommendation to order PET CT scan and MRI to assist in accurate staging and treatment planning Unfortunately, due to her insurance issue, we are not able to accommodate her treatment here without excessive costs out-of-pocket for the patient and family Per patient's family request, we have directed her imaging studies to be done at [facility] location which is within her insurance network We will also send the referral for the patient to be seen, evaluated and treated in that location I did not go into details with the plan of care and treatment options with the patient as I will not be her treating physician I discussed the plan with Dr. Denman George and she is in agreement for the patient to be referred to that location I will get my GYN oncology navigator to assist in arranging appointment for her to be seen and treated there

## 2020-09-07 NOTE — Telephone Encounter (Signed)
Called and faxed stat referral to [place] [facility] at [phone]. They will contact the patient with the appointment.  Also called [facility] directly to refer for medical oncology and radiation oncology and they said the Gyn Oncologist would refer if needed.

## 2020-09-08 ENCOUNTER — Ambulatory Visit: Payer: BLUE CROSS/BLUE SHIELD | Admitting: Radiation Oncology

## 2020-09-08 ENCOUNTER — Ambulatory Visit: Payer: BLUE CROSS/BLUE SHIELD

## 2020-09-08 ENCOUNTER — Encounter: Payer: Self-pay | Admitting: Hematology and Oncology

## 2020-09-08 NOTE — Progress Notes (Signed)
Rcv'd voicemail from nurse Santiago Glad requesting I call pt's daughter to discuss financial concerns so I called her and she informed me that pt was referred to another cancer center that accepts her insurance so pt will not be receiving treatment here but asked about finance assistance for the services her mom has already received.  I informed her the financial assistance the hospital offers are for uninsured pt's but she could apply for the [facility] program if her balance is $5000 or greater.  She confirmed her mom has a balance of over $5000 so I gave her the number to customer service to discuss further questions they may have.

## 2020-09-09 ENCOUNTER — Telehealth: Payer: Self-pay | Admitting: Oncology

## 2020-09-09 NOTE — Telephone Encounter (Signed)
Called Alicia Brandt and asked about her FMLA paperwork.  Advised her to try to have it filled out by [facility] as Alicia Brandt will be receiving treatment there.  Also advised her that [facility] will be calling [name] for an appointment.

## 2020-09-09 NOTE — Telephone Encounter (Signed)
[facility] regarding status of referral.  They are working on getting her scheduled.  Gave them her daughter, Dashia's information to contact in case they are not able to reach [facility] directly.

## 2020-09-12 ENCOUNTER — Other Ambulatory Visit: Payer: Self-pay | Admitting: Oncology

## 2020-09-12 NOTE — Progress Notes (Signed)
Gynecologic Oncology Multi-Disciplinary Disposition Conference Note  Date of the Conference: 09/12/2020  Patient Name: Alicia Brandt  Referring Provider: Dr. Ihor Dow Primary GYN Oncologist: Dr. Denman George  Stage/Disposition:  Clinical stage IB3 vs stage IIB. Disposition is to MRI and PET scan followed by definitive chemoradiation.  Consideration for interval extrafascial hysterectomy if good response to chemoradiation.   This Multidisciplinary conference took place involving physicians from [place], [place], Radiation Oncology, Pathology, Radiology along with the Gynecologic Oncology Nurse Practitioner and RN.  Comprehensive assessment of the patient's malignancy, staging, need for surgery, chemotherapy, radiation therapy, and need for further testing were reviewed. Supportive measures, both inpatient and following discharge were also discussed. The recommended plan of care is documented. Greater than 35 minutes were spent correlating and coordinating this patient's care.

## 2020-09-12 NOTE — Progress Notes (Deleted)
Gynecologic Oncology Multi-Disciplinary Disposition Conference Note  Date of the Conference: ***  Patient Name: Alicia Brandt  Referring Provider: Primary GYN Oncologist:  Stage/Disposition:  Disposition is to ***.   This Multidisciplinary conference took place involving physicians from [place], [place], Radiation Oncology, Pathology, Radiology along with the Gynecologic Oncology Nurse Practitioner and RN.  Comprehensive assessment of the patient's malignancy, staging, need for surgery, chemotherapy, radiation therapy, and need for further testing were reviewed. Supportive measures, both inpatient and following discharge were also discussed. The recommended plan of care is documented. Greater than 35 minutes were spent correlating and coordinating this patient's care. Gynecologic Oncology Multi-Disciplinary Disposition Conference Note  Date of the Conference: 09/12/2020  Patient Name: Alicia Brandt  Referring Provider: Dr. Ihor Dow Primary GYN Oncologist: Dr. 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  Disposition is to MRI and PET scan followed by definitive chemoradiation.  Consideration for interval extrafascial hysterectomy if good response to chemoradiation.   This Multidisciplinary conference took place involving physicians from [place], [place], Radiation Oncology, Pathology, Radiology along with the Gynecologic Oncology Nurse Practitioner and RN.  Comprehensive assessment of the patient's malignancy, staging, need for surgery, chemotherapy, radiation therapy, and need for further testing were reviewed. Supportive measures, both inpatient and following discharge were also discussed. The recommended plan of care is documented. Greater than 35 minutes were spent correlating and coordinating this patient's care.

## 2020-09-13 ENCOUNTER — Telehealth: Payer: Self-pay

## 2020-09-13 ENCOUNTER — Other Ambulatory Visit (HOSPITAL_COMMUNITY): Payer: BLUE CROSS/BLUE SHIELD

## 2020-09-13 NOTE — Telephone Encounter (Signed)
Told Alicia Brandt that the Pet Scan shows the cervical mass which was know. No evidence of spread or metastatic disease. A nodule in the thyroid is lighting up on the PET. This nodule needs to be evaluated with an [place].  Dr. Claiborne Billings at [facility] can order this for you. Mention it to him at new patient visit. Thd scan also is showing some scarring bilaterally in the axillary regions from her having infections under her arms with her menstrual cycles. Pt verbalized understanding.

## 2020-09-13 NOTE — Telephone Encounter (Signed)
LM for Alicia Brandt to call the office to discuss the results of the PET Scan from 09-12-20.

## 2020-09-14 ENCOUNTER — Institutional Professional Consult (permissible substitution): Payer: BLUE CROSS/BLUE SHIELD | Admitting: Radiation Oncology

## 2020-09-23 ENCOUNTER — Ambulatory Visit: Payer: BLUE CROSS/BLUE SHIELD | Admitting: Gynecologic Oncology

## 2020-11-07 NOTE — Telephone Encounter (Signed)
error 

## 2020-11-28 ENCOUNTER — Encounter: Payer: Self-pay | Admitting: Gynecologic Oncology

## 2021-01-16 IMAGING — CT CT ABD-PELV W/ CM
2 of 5 series · 16 of 46 positions shown, 18 images · IV contrast (omnipaque)
Comparison: Limited correlation made with chest CTA 04/05/2019

CLINICAL DATA: Left lower quadrant abdominal pain with bloody
stool. Clinical concern for diverticulitis and nephrolithiasis.

EXAM:
CT ABDOMEN AND PELVIS WITH CONTRAST
TECHNIQUE: Multidetector CT imaging of the abdomen and pelvis was performed
using the standard protocol following bolus administration of
intravenous contrast.
CONTRAST:  100mL OMNIPAQUE IOHEXOL 300 MG/ML  SOLN

[Series 3: abd/ pelvis 5.0 i30f 2 · axial · 0.94mm/px · z∈[+1046,+1436]mm · 13 of 88 slices shown, 15 images]
[im 5/88  soft-tissue]
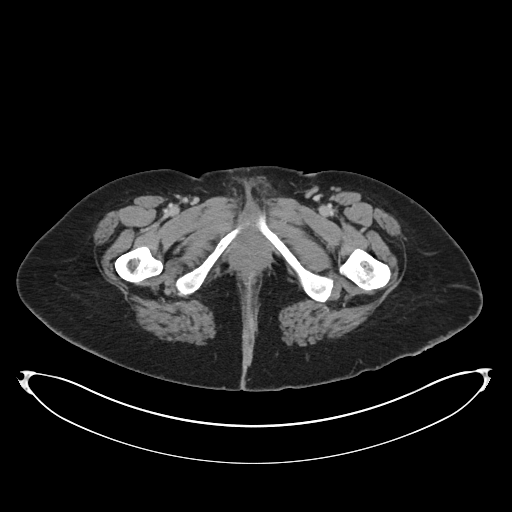
[im 5/88  bone]
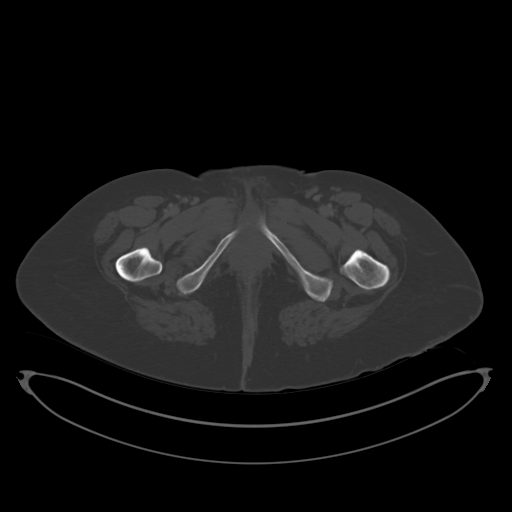
[im 14/88  soft-tissue]
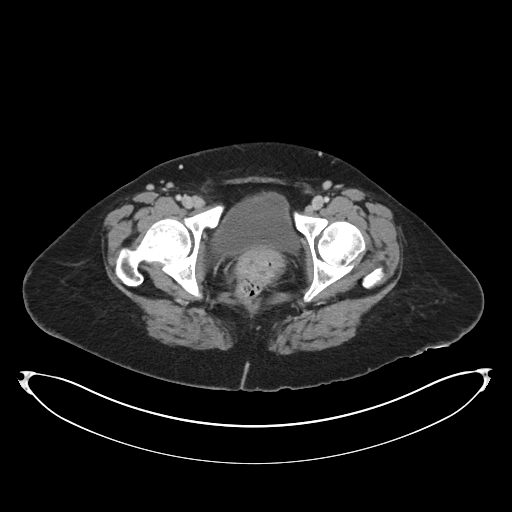
[im 18/88  soft-tissue]
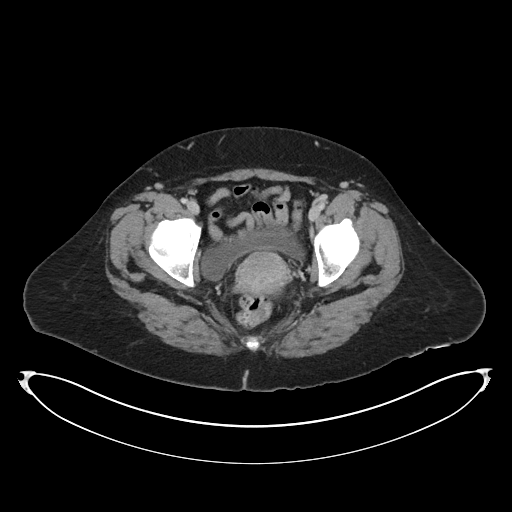
[im 27/88  soft-tissue]
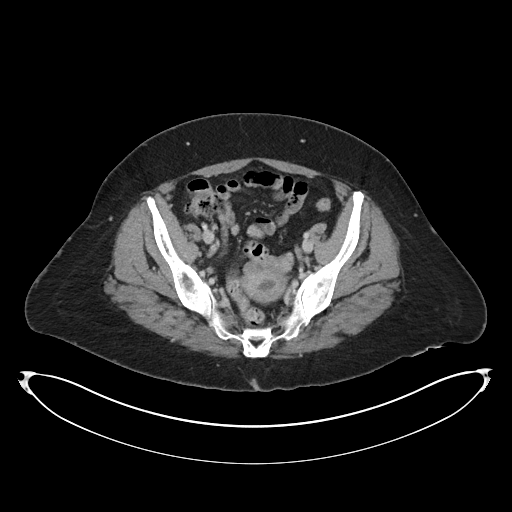
[im 31/88  soft-tissue]
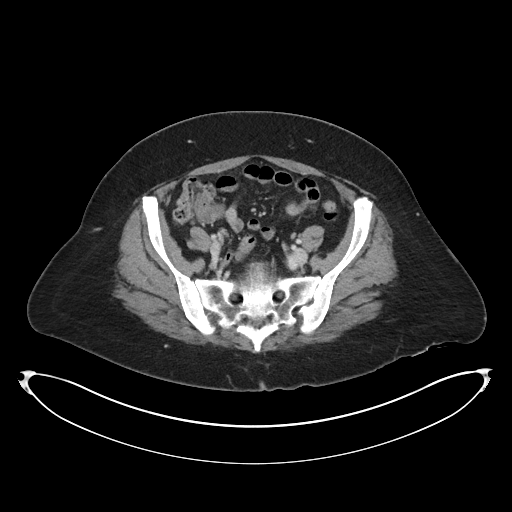
[im 40/88  soft-tissue]
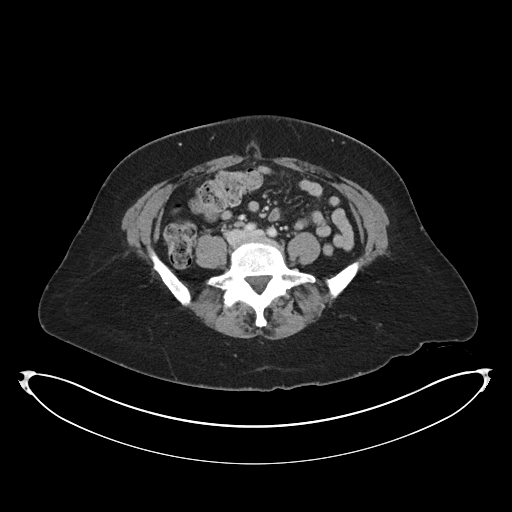
[im 44/88  soft-tissue]
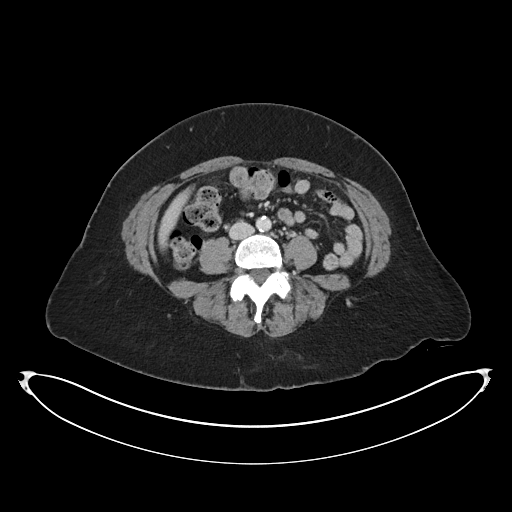
[im 48/88  soft-tissue]
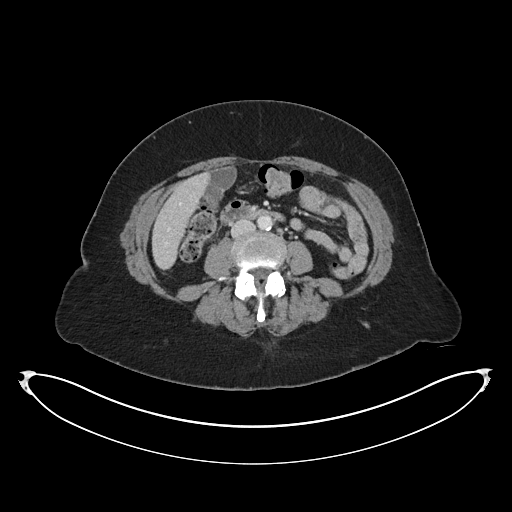
[im 57/88  soft-tissue]
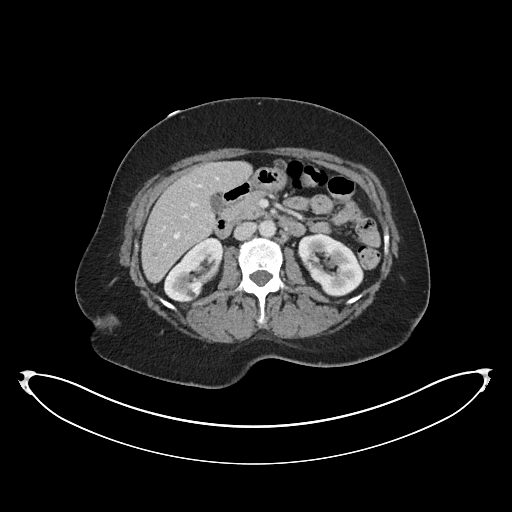
[im 57/88  bone]
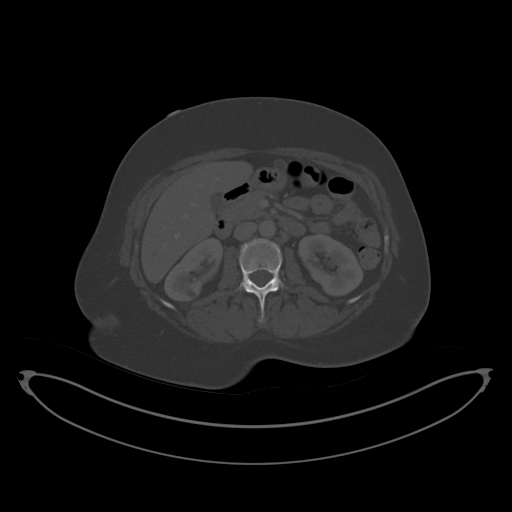
[im 61/88  soft-tissue]
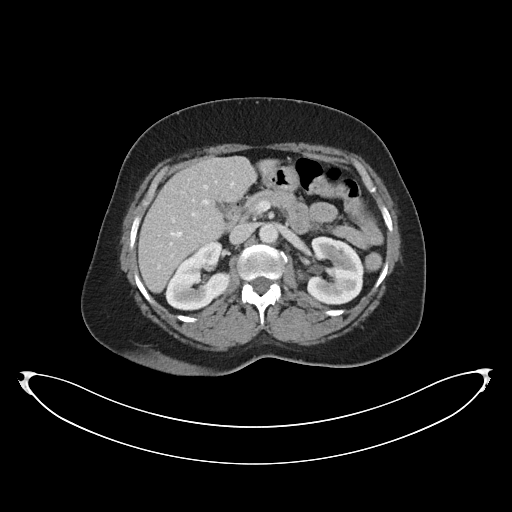
[im 70/88  soft-tissue]
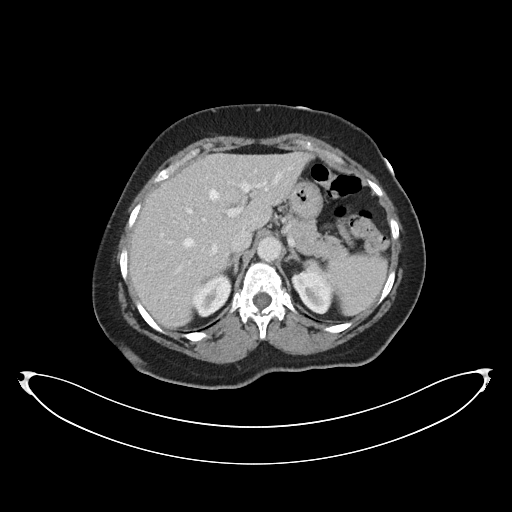
[im 74/88  soft-tissue]
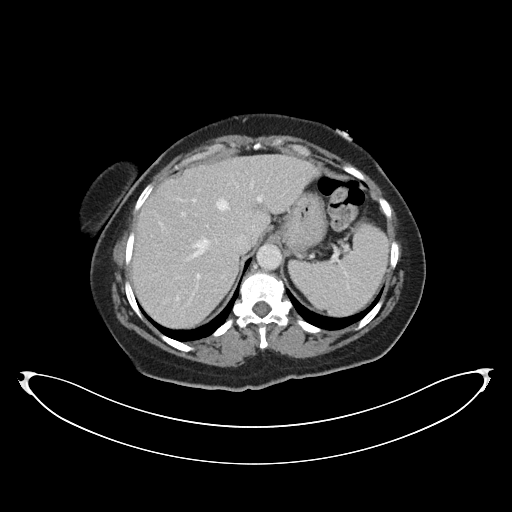
[im 83/88  soft-tissue]
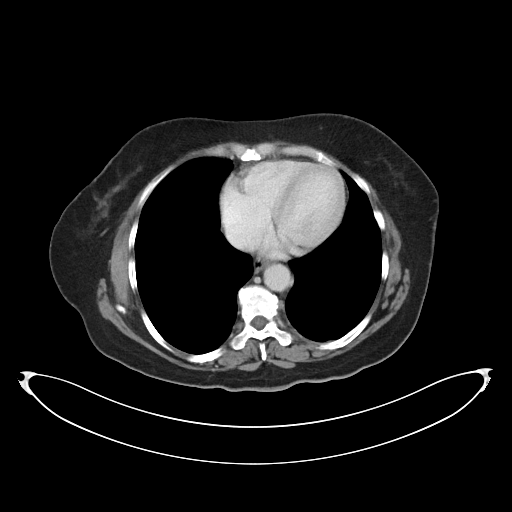

[Series 6: coronal soft tissue · coronal · 0.76mm/px · 3 of 105 slices shown]
[im 35/105  soft-tissue]
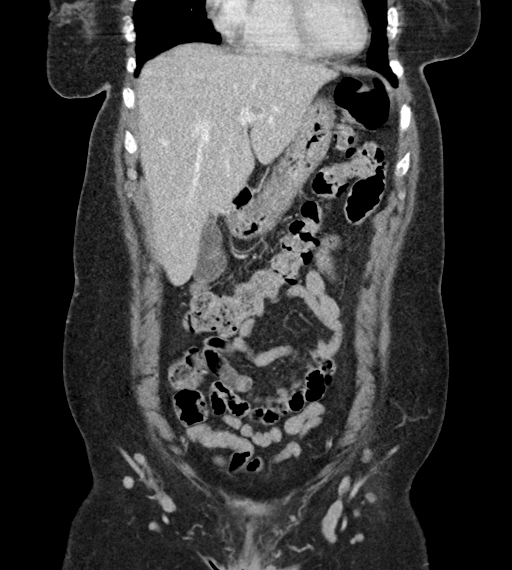
[im 47/105  soft-tissue]
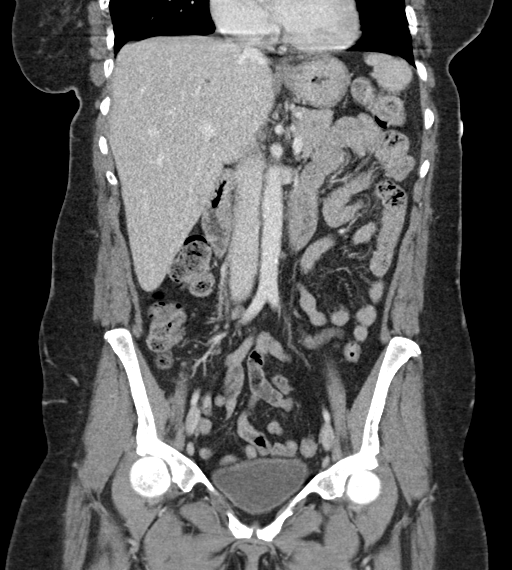
[im 58/105  soft-tissue]
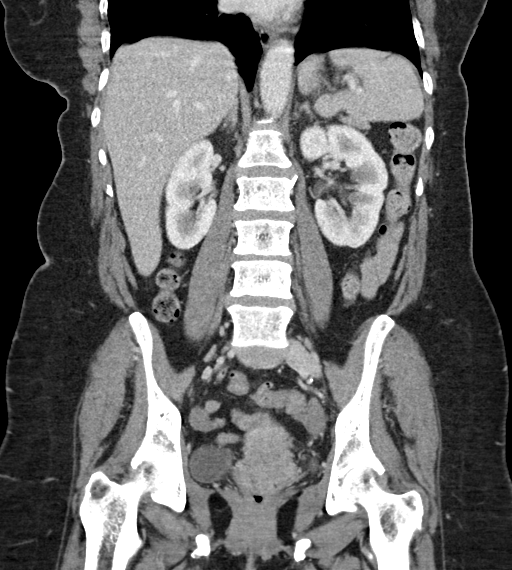

[16 of 46 positions shown; findings below may reference images not displayed]

FINDINGS: Lower chest: Clear lung bases. No significant pleural or pericardial
effusion.

Hepatobiliary: Tiny low-density lesions within the right hepatic
lobe on images 14 and 31 of series 3 are likely cysts. No suspicious
hepatic findings. No evidence of gallstones, gallbladder wall
thickening or biliary dilatation.

Pancreas: Unremarkable. No pancreatic ductal dilatation or
surrounding inflammatory changes.

Spleen: Normal in size without focal abnormality.

Adrenals/Urinary Tract: Both adrenal glands appear normal. No
evidence of urinary tract calculus, hydronephrosis or perinephric
soft tissue stranding. There are subcentimeter low-density renal
lesions bilaterally, likely cysts. The bladder appears unremarkable.

Stomach/Bowel: No evidence of bowel wall thickening, distention or
surrounding inflammatory change. The appendix appears normal. Mild
sigmoid diverticulosis without evidence of acute inflammation.

Vascular/Lymphatic: There are no enlarged abdominal or pelvic lymph
nodes. Aortic and branch vessel atherosclerosis. No acute vascular
findings. The portal, superior mesenteric and splenic veins are
patent.

Reproductive: No adnexal mass. The endometrial canal is distended to
13 mm. Asymmetric soft tissue fullness posteriorly in the uterine
body may reflect a fibroid. There is nonspecific soft tissue
fullness of the cervix. No surrounding inflammatory changes.

Other: No evidence of abdominal wall mass or hernia. No ascites.

Musculoskeletal: No acute or significant osseous findings.
IMPRESSION: 1. No acute findings or clear explanation for the patient's
symptoms. No evidence of urinary tract calculus or hydronephrosis.
2. Mild sigmoid diverticulosis without evidence of acute
inflammation.
3. Nonspecific soft tissue fullness of the cervix with distension
endometrial canal, potentially reflecting a cervical or endometrial
mass in this presumably postmenopausal patient. Correlation with GYN
examination recommended. Pelvic ultrasound may be helpful for
further evaluation.
4. Aortic Atherosclerosis ([id number]).

## 2021-07-18 ENCOUNTER — Telehealth: Payer: Self-pay

## 2021-07-18 NOTE — Telephone Encounter (Signed)
Error
